# Patient Record
Sex: Male | Born: 1945 | Race: White | Hispanic: No | Marital: Married | Smoking: Never smoker
Health system: Southern US, Community
[De-identification: ages and names within clinical notes are randomized; demographics above are authoritative.]

## PROBLEM LIST (undated history)

## (undated) DIAGNOSIS — R519 Headache, unspecified: Secondary | ICD-10-CM

## (undated) DIAGNOSIS — R011 Cardiac murmur, unspecified: Secondary | ICD-10-CM

## (undated) DIAGNOSIS — I219 Acute myocardial infarction, unspecified: Secondary | ICD-10-CM

## (undated) DIAGNOSIS — M1990 Unspecified osteoarthritis, unspecified site: Secondary | ICD-10-CM

## (undated) DIAGNOSIS — I2510 Atherosclerotic heart disease of native coronary artery without angina pectoris: Secondary | ICD-10-CM

## (undated) DIAGNOSIS — G4730 Sleep apnea, unspecified: Secondary | ICD-10-CM

## (undated) DIAGNOSIS — I10 Essential (primary) hypertension: Secondary | ICD-10-CM

## (undated) DIAGNOSIS — R413 Other amnesia: Secondary | ICD-10-CM

## (undated) HISTORY — PX: BACK SURGERY: SHX140

## (undated) HISTORY — PX: CORONARY ARTERY BYPASS GRAFT: SHX141

---

## 2001-03-23 ENCOUNTER — Encounter: Payer: Self-pay | Admitting: Physical Medicine and Rehabilitation

## 2001-03-23 ENCOUNTER — Encounter
Admission: RE | Admit: 2001-03-23 | Discharge: 2001-03-23 | Payer: Self-pay | Admitting: Physical Medicine and Rehabilitation

## 2001-05-04 ENCOUNTER — Ambulatory Visit (HOSPITAL_COMMUNITY): Admission: RE | Admit: 2001-05-04 | Discharge: 2001-05-04 | Payer: Self-pay | Admitting: Orthopaedic Surgery

## 2001-05-04 ENCOUNTER — Encounter: Payer: Self-pay | Admitting: Orthopaedic Surgery

## 2001-06-25 ENCOUNTER — Encounter: Payer: Self-pay | Admitting: Orthopaedic Surgery

## 2001-07-02 ENCOUNTER — Encounter (INDEPENDENT_AMBULATORY_CARE_PROVIDER_SITE_OTHER): Payer: Self-pay | Admitting: Specialist

## 2001-07-02 ENCOUNTER — Inpatient Hospital Stay (HOSPITAL_COMMUNITY): Admission: RE | Admit: 2001-07-02 | Discharge: 2001-07-06 | Payer: Self-pay | Admitting: Orthopaedic Surgery

## 2001-07-02 ENCOUNTER — Encounter: Payer: Self-pay | Admitting: Orthopaedic Surgery

## 2001-07-05 ENCOUNTER — Encounter: Payer: Self-pay | Admitting: Orthopaedic Surgery

## 2002-02-26 ENCOUNTER — Ambulatory Visit (HOSPITAL_COMMUNITY): Admission: RE | Admit: 2002-02-26 | Discharge: 2002-02-26 | Payer: Self-pay | Admitting: Specialist

## 2003-03-17 ENCOUNTER — Encounter: Admission: RE | Admit: 2003-03-17 | Discharge: 2003-03-17 | Payer: Self-pay | Admitting: Orthopaedic Surgery

## 2003-03-17 ENCOUNTER — Encounter: Payer: Self-pay | Admitting: Orthopaedic Surgery

## 2003-06-21 ENCOUNTER — Encounter: Admission: RE | Admit: 2003-06-21 | Discharge: 2003-06-21 | Payer: Self-pay | Admitting: Orthopaedic Surgery

## 2003-11-22 ENCOUNTER — Encounter: Admission: RE | Admit: 2003-11-22 | Discharge: 2003-11-22 | Payer: Self-pay | Admitting: Orthopaedic Surgery

## 2003-12-02 ENCOUNTER — Encounter: Admission: RE | Admit: 2003-12-02 | Discharge: 2003-12-02 | Payer: Self-pay | Admitting: Orthopaedic Surgery

## 2006-05-22 ENCOUNTER — Inpatient Hospital Stay (HOSPITAL_COMMUNITY): Admission: RE | Admit: 2006-05-22 | Discharge: 2006-05-25 | Payer: Self-pay | Admitting: Orthopedic Surgery

## 2009-07-13 ENCOUNTER — Encounter: Admission: RE | Admit: 2009-07-13 | Discharge: 2009-07-13 | Payer: Self-pay | Admitting: Orthopedic Surgery

## 2010-11-09 NOTE — Discharge Summary (Signed)
[facility]  Patient:    [name], [name] Visit Number: [id number] MRN: [id number]          Service Type: SUR Location: 4W 0480 01 Attending Physician:  Patricia Nettle Dictated by:   Dorie Rank, P.A. Admit Date:  07/02/2001 Discharge Date: 07/06/2001   CC:         [facility]   Discharge Summary  ADMISSION DIAGNOSES: 1. 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 2. Hypertension. 3. History of remote myocardial infarction.  DISCHARGE DIAGNOSES: 1. Status post revision L2-L3 decompression with pedicle screws and left    posterior iliac bone grafting. 2. 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 3. Hypertension. 4. History of remote myocardial infarction.  PROCEDURE:  On July 02, 2001, the patient underwent a revision of L2-L3 decompression, pedicle screw fusion L2-L3 and left posterior iliac bone grafting.  Surgeon Dr. Sharolyn Douglas, assistant Haze Rushing, P.A.-C.  Anesthesia general followed by intrathecal morphine intraoperatively.  No complications.  CONSULTATIONS:  None.  HISTORY OF PRESENT ILLNESS:  This patient is a 65 year old, white male who was seen by Dr. Noel Gerold for continuing problems concerning his low back.  He has underwent epidural steroid injections, previous back surgeries, however, he has really not improved with that.  He had low back pain with radiation to the right thigh when seen by Dr. Noel Gerold in the office.  It was noted that he tried to continue to work, but was having real problems with continuing on his day to day activities.  He had tried nonsteroidal and analgesics for pain, however, this only minimized his pain.  He had a discogram which showed pathology at the L2-L3 interspace with abnormal morphology.  Due to his physical exam as well as diagnostic studies, it was felt he would  benefit from undergoing the above-stated surgery.  The risks and benefits of those procedures were discussed with the patient and he had agreed to proceed.  The patient obtained preoperative clearance by Dr. Leonette Most, of [facility].  HOSPITAL COURSE:  The patient had the above-stated surgery without complications.  He did have the intrathecal morphine intraoperatively and was admitted for continuous pulse oximetry.  He was noted to have no complications and was eventually transferred to the orthopedic floor.  Hemovac drain was placed while in the operating room.  This was discontinued by postop day #2 without difficulty.  For the first night, he was on bed rest and then he was up with physical therapy with the brace.  He was weightbearing as tolerated on the brace.  Initially, he was NPO until flatus.  By postop day #2, clear liquids were started and regular diet was eventually resumed and he tolerated this well with a bowel movement prior to coming home.  IV Ancef was used for the first 48 hours for postop infection control.  A PCA morphine was used and this was discontinued by postop day #2.  He utilized p.o. Percocet throughout the remainder of his hospital stay for pain control.  His home medications were resumed.  He utilized incentive spirometry while in the hospital. [employer] was seeing the patient and fitted him with an LSO brace which he used while in the hospital.  Hemoglobin and hematocrit were checked daily x2 days and found to be stable.  BMET was found to be stable as well.  Occupational and physical therapy  worked with patient on ambulation.  His dressing was changed beginning on postop day #2, and was found to be free of any erythema or drainage.  It was clean and dry on postop day #1.  On July 06, 2001, he was felt to be medically and orthopedically stable for discharge.  LABORATORY DATA AND X-RAY FINDINGS:  H&H on July 03, 2001, was 12.6 and 36.9.   Hemoglobin and hematocrit on July 04, 2001, 12.3 and 36.0.  Coagulation studies on June 25, 2001, within normal limits.  Preoperative BMET on June 25, 2001, revealed a glucose of 301 and albumin of 3.4 on July 03, 2001.  BMET revealed a sodium of 131, glucose 236, calcium 8.0 and otherwise normal.  Liver function studies on June 25, 2001, within normal limits.  On June 25, 2001, UA revealed specific gravity 1.04, 1000 glucose and otherwise normal.  Blood type from July 02, 2001, was O negative.  Two-view chest x-ray on June 25, 2001, revealed no active disease with minimal right basilar atelectasis.  Portable spine views on July 02, 2001, revealed localization of L3 and L4, pedicle screws at L2-L3 and good position of L2-L3 posterior fusion with space device at L2-L3.  The patient had tracings on the chart from June 03, 2001, at Dr. Heinz Knuckles office which revealed normal sinus rhythm.  CONDITION ON DISCHARGE:  Improved.  DISPOSITION:  Discharged to home.  DISCHARGE MEDICATIONS: 1. Resume home medicines except for Celebrex. 2. Vicodin. 3. Robaxin.  ACTIVITY:  Low back precautions.  No lifting greater than five pounds.  No bending or twisting.  Up with brace.  DIET:  Regular diet.  WOUND CARE:  Daily dressing changes.  Shower when no drainage from incision.  FOLLOWUP:  Follow up in 7-10 days with Dr. Noel Gerold.  Call for an appointment.  SPECIAL INSTRUCTIONS:  He should call should he have any questions or concerns prior to his appointment. Dictated by:   Dorie Rank, P.A. Attending Physician:  Patricia Nettle. DD:  07/15/01 TD:  07/16/01 Job: [id number]

## 2010-11-09 NOTE — H&P (Signed)
[facility]  Patient:    [name], [name] Visit Number: [id number] MRN: [id number]          Service Type: Attending:  Patricia Nettle, M.D. Dictated by:   Druscilla Brownie. Shela Nevin, P.A. Adm. Date:  07/02/01   CC:         Loyal Jacobson, M.D., [facility]   History and Physical  DATE OF BIRTH:  April 05, 1946  CHIEF COMPLAINT:  "Pain in my back and legs."  HISTORY OF PRESENT ILLNESS:  This is a 65 year old white male, who has been seen by Dr. Noel Gerold for continuing problems concerning his low back.  His original injury occurred on August 13, 1999.  He eventually underwent epidural steroid injection at [facility]  Eventually he was referred to Dr. Ricki Miller at the [facility] and underwent back surgery on March 25, 2000.  Unfortunately he has really not improved with that.  He sought another opinion and eventually was seen by [place].  The patient continues with pain in the low back with radiation into the right thigh.  He has tried to continue with his work, but now has had real problems with continuing on his day to day activities.  We have given him Lortab for discomfort.  He has weakness in the iliopsoas on the right compared to the left.  He has absent reflexes in the right knee compared to 2+ at the knee on the left.  He has a positive femoral stretch test as well.  Discogram showed disk pathology at the L2-3 interspace with abnormal morphology.  Due to these positive findings and the fact that the patient is really having difficulty with his day to day activities, and the positive findings seen on the examination as well as testing, show that the patient has a recurrent HNP at L2-3 with degenerative disk disease and spinal stenosis.  For this reason we have decided to go ahead and admit him for redo of a lumbar laminectomy, L2-3 with L2-3 posterior spinal fusion with pedicle screws and possible posterolateral  interbody fusion and posterior iliac crest bone graft and allograft.  Shell saver will be used and he has donated two units of autologous blood for this procedure.  PAST MEDICAL HISTORY:  This patient has been cleared preoperatively by Dr. Leonette Most of [facility]. 1. The patient has a history of a myocardial infarction about 11 years ago.    He changed his lifestyle including stopping smoking and has done well    since. 2. He also is being treated for hypertension.  CARDIOLOGIST:  Dr. Beverely Pace in [facility] is his cardiologist.  CURRENT MEDICATIONS: 1. One aspirin a day (will stop prior to surgery). 2. Atenolol 25 mg one q.d. 3. Lisinopril 20 mg one q.d. 4. Lipitor 10 mg one q.d. 5. Allegra 180 mg one q.d. 6. Celebrex 200 mg one q.d. 7. Vicodin for discomfort.  ALLERGIES:  He has no known drug allergies.  PREVIOUS SURGERIES:  Back surgery as mentioned above.  SOCIAL HISTORY:  The patient neither smokes nor drinks.  He is married.  He is an Personnel officer.  He has two children.  FAMILY HISTORY:  Noncontributory.  REVIEW OF SYSTEMS:  CNS:  No seizures, paralysis, double vision, but pain and radiculitis as well as weakness as mentioned above.  CARDIOVASCULAR:  No chest pain, no angina, no orthopnea.  RESPIRATORY:  No productive cough, no hemoptysis, no shortness of breath.  GASTROINTESTINAL:  No nausea, vomiting, melena,  or bloody stools.  GENITOURINARY:  No discharge, dysuria, or hematuria.  MUSCULOSKELETAL:  Primarily in present illness.  PHYSICAL EXAMINATION:  GENERAL:  Alert, cooperative, and friendly, somewhat obese 65 year old white male.  VITAL SIGNS:  Blood pressure 138/78, pulse 48, respirations are 12.  HEENT:  Normocephalic, PERRLA, EOM intact.  Oropharynx is clear.  CHEST:  Clear to auscultation, no rhonchi, no rales.  HEART:  Regular rate and rhythm.  No murmurs are heard.  ABDOMEN:  Obese, soft, nontender.  Liver and spleen not  felt.  GENITALIA/RECTAL:  Not done, not pertinent to present illness.  EXTREMITIES:  Weakness on the right as mentioned above.  ADMITTING DIAGNOSES: 1. Recurrent herniated nucleus pulposus, L2-3 with degenerative disk disease  and spinal stenosis. 2. Hypertension. 3. History of remote myocardial infarction.  PLAN:  The patient will undergo lumbar laminectomy, L2-3, with L2-3 posterior spinal fusion with pedicle screws.  Possible posterolateral interbody fusion and posterior iliac crest bone graft and allograft.  If we should have any medical or cardiology problems, we will need to contact a local cardiologist as this patients physicians are in [facility]. Dictated by:   Druscilla Brownie. Shela Nevin, P.A. Attending:  Patricia Nettle, M.D. DD:  06/23/01 TD:  06/23/01 Job: [id number]

## 2010-11-09 NOTE — Discharge Summary (Signed)
[name], [name] NO.:  [id number]   MEDICAL RECORD NO.:  [id number]          PATIENT TYPE:  INP   LOCATION:  5017                         FACILITY:  [facility]   PHYSICIAN:  Almedia Balls. Ranell Patrick, M.D. DATE OF BIRTH:  Nov 27, 1945   DATE OF ADMISSION:  05/22/2006  DATE OF DISCHARGE:  05/25/2006                               DISCHARGE SUMMARY   ADMISSION DIAGNOSIS:  Left knee end-stage osteoarthritis.   DISCHARGE DIAGNOSIS:  Left knee end-stage osteoarthritis.   PROCEDURE PERFORMED:  Left knee total knee replacement by Dr. Ranell Patrick,  again, performed on 05/22/06.   CONSULTING SERVICES:  Physical therapy, occupational therapy, discharge  planning.   HISTORY OF PRESENT ILLNESS:  The patient is a 65 year old male with a  history of worsening left knee pain.  The patient has end-stage  osteoarthritis on x-rays, failed all conservative means, consisting of  activity modification, anti-inflammatory medications, injections, who  presents now for total knee replacement to restore function and  eliminate pain.  For further details of the patient's past medical  history and physical examination, please see the medical record.   HOSPITAL COURSE:  The patient was admitted to the orthopedic surgery  service and taken to surgery on 05/22/06.  The patient had a left total  knee replacement performed.  The patient postoperatively went to the  floor, where he had an uncomplicated postoperative course, remaining  afebrile, following a tolerating a regular diet, and was up with  physical therapy prior to his discharge.  He was discharged to home in  stable condition on a regular diet with his preadmission medications  plus medication for pain and a muscle relaxant.  He will be following up  with physical therapy, home health physical therapy and OT, as well as  home health nursing.  He was placed on Coumadin per pharmacy protocol.  We will be seeing him in the orthopedic office in 2  weeks.      Almedia Balls. Ranell Patrick, M.D.  Electronically Signed     SRN/MEDQ  D:  08/06/2006  T:  08/07/2006  Job:  [id number]

## 2010-11-09 NOTE — H&P (Signed)
[name], [name]               ACCOUNT NO.:  [id number]   MEDICAL RECORD NO.:  [id number]          PATIENT TYPE:  INP   LOCATION:  NA                           FACILITY:  [facility]   PHYSICIAN:  Maisie Fus B. Dixon, P.A.  DATE OF BIRTH:  [birth year]   DATE OF ADMISSION:  DATE OF DISCHARGE:                                HISTORY & PHYSICAL   CHIEF COMPLAINT:  Left knee pain.   HISTORY OF PRESENT ILLNESS:  The patient has had increasing left knee pain  and worsening ambulation and gait over the last several months.  It has been  refractory to conservative treatment.  The patient has elected to have a  total knee arthroplasty by Dr. Malon Kindle.   PAST MEDICAL HISTORY:  Past medical history is significant for hypertension  and diabetes.   FAMILY MEDICAL HISTORY:  Family medical history is coronary artery disease,  diabetes, and osteoarthritis.   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  Does  not smoke or use alcohol and is a patient of Dr. Leonette Most.   DRUG ALLERGIES:  PERCOCET.   MEDICATIONS:  1. Lisinopril 40 mg 1/2 tablet p.o. b.i.d.  2. Simvastatin 20 mg p.o. daily.  3. Omeprazole 20 mg p.o. daily.  4. Allegra 180 mg p.o. daily.  5. Aspirin 81 mg p.o. daily.  6. Vicodin 5 mg 1 to 2 tablets q.4-6 h. p.r.n. pain.  7. Glucosamine chondroitin.  8. Metformin 1000 mg p.o. b.i.d.  9. Glipizide 10 mg p.o. b.i.d.  10.Niacin 500 mg p.o. b.i.d.   REVIEW OF SYSTEMS:  Negative except for pain with ambulation.   PHYSICAL EXAMINATION:  The patient has a pulse of 80, respirations 16, blood  pressure 138/76.  GENERAL:  This is a pleasant healthy-appearing 65 year old male in no acute  distress.  Pleasant mood and affect.  Alert and oriented x3.  Examination of the head and neck shows full range of motion.  Sensation  grossly intact.  Cranial Nerves II-XII are grossly intact.  CHEST:  Active breath sounds bilaterally with no wheezes, rhonchi, or rales.  HEART:   Regular rate and rhythm, but he does have a 3/6 holosystolic murmur.  ABDOMEN:  Nontender and nondistended with active bowel sounds.  EXTREMITIES:  Moderate tenderness to the left knee with a mildly antalgic  gait and moderate crepitus with range of motion.  SKIN:  No rashes.  NEUROLOGIC:  He is intact distal bilateral lower and upper extremities.   X-ray shows severe osteoarthritis to his left knee.   IMPRESSION:  Left knee osteoarthritis, severe.   PLAN:  Plan of action is to have a left total knee arthroplasty by Dr.  Malon Kindle.      Thomas B. Ferne Coe.     TBD/MEDQ  D:  05/14/2006  T:  05/14/2006  Job:  [id number]

## 2010-11-09 NOTE — Op Note (Signed)
[facility]  Patient:    [name], [name] Visit Number: [id number] MRN: [id number]          Service Type: SUR Location: 4W 0480 01 Attending Physician:  Patricia Nettle Dictated by:   Patricia Nettle, M.D. Proc. Date: 07/02/01 Admit Date:  07/02/2001                             Operative Report  DATE OF BIRTH:  April 20, 1946  PROCEDURES:  1. Revision lumbar laminectomy L2-3 with lateral recess decompression,     facetectomy, and diskectomy.  2. L2-3 posterior lateral arthrodesis.  3. Pedicle screw instrumentation L2 and L3.  4. Transforaminal lumbar interbody fusion L2-3 with Synthes Peek interbody     prosthesis.  5. Local autogenous bone graft.  6. Left posterior iliac crest bone graft.  7. Allograft using DBX mix.  8. Intrathecal injection of 2 cc long-acting preservative-free morphine.  DIAGNOSES:  1. Recurrent disk herniation L2-3.  2. Degenerative disk disease.  3. Diabetes.  SURGEON:  Patricia Nettle, M.D.  ASSISTANT:  Dorie Rank, P.A.  ANESTHESIA:  General endotracheal.  COMPLICATIONS:  None.  INDICATIONS FOR PROCEDURE:  The patient is a 65 year old male who is status post a right L2-3 microlumbar diskectomy in October of 2001. After the procedure, his symptoms were no better. Over the last several years, he has actually gotten worse. 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 His CT myelogram showed a recurrent herniation at L2-3 with compromise of the L2 and 3 roots. Provocative diskography showed concordant pain L2 to the sacrum. He did have a vacuum disk phenomenon at L2-3 with concordant pain at 7 PSI. His EMG study was consistent with a right L3 and 4 radiculopathy. After a long discussion with the patient, it was decided to address the L2-3 segment with revision decompression and fusion. He understands that he did have a four level positive diskogram and he may very well have  continued back pain after the procedure.  DESCRIPTION OF PROCEDURE:  The patient was properly identified in the holding area and taken to the operating room. He was given prophylactic antibiotics. He underwent general endotracheal anesthesia without difficulty. Appropriate lines were placed. He was turned prone onto the [employer] frame. Care was taken to pad all bony prominences and peripheral nerves. The face and eyes were protected. The back was prepped and draped in the usual sterile fashion. A midline incision was utilized incorporating the old scar and extending it 2 cm in each direction. Dissection was carried down the spinous processes. At this point, two Kochers were placed and an intraoperative spot film was taken. Confirmation of the L2 and 3 levels was made. The paraspinal muscles were then stripped out to the tips of the transverse processes at L2 and 3. Care was taken to protect the L1-2 as well as the 3-4 joint below. The capsule was removed from the L2-3 segment bilaterally. Extreme care was taken when approaching the previous laminotomy defect at L2-3 on the right. Revision microsurgical decompression was then performed by removing the spinous process of L2. There was extensive scarring which caught the midline to the left side. The right L3 nerve root was encased and scar tissue in this was meticulously dissected out of the lateral recess. A facetectomy was performed on the right side, removing the remnant of inferior facet  as well as the tip of the superior facet. This allowed additional exposure of the L3 root and further neurolysis could be carried out. At this point, the L2-3 disk was identified on the right side within the foramen. Epidural veins were carefully coagulated with the bipolar. Care was taken to protect the exiting L2 root. This was not exposed but retracted proximally with the surrounding soft tissue. At this point, pedicle screws were  placed at L2 and 3 bilaterally using an anatomic probing technique. First the awl followed by the pedicle probe and then the ball-tip feeler tap and screw. The 45 mm 6.2 Synthes Click X screws were placed at L2 bilaterally. At L3, a 40 mm 6.2 diameter Synthes Click X screws were placed bilaterally. At this point, distraction was applied across the left sided pedicle screws. This allowed further access to the right transforaminal window that had been created. An annulotomy was performed using a 15 blade. Again extreme care was taken to protect the exiting L2 root. Pituitary rongeurs were used to remove the remaining disk material within the intervertebral space. Side biting curettes and reamed curettes were then utilized to remove the cartilaginous endplate. This was further debrided using the pituitary rongeurs. A rasp was then utilized to roughen the endplate up both inferiorly and superiorly. Copious irrigation was used within the disk space. At this point, attention was directed to the left posterior iliac crest. A 4 cm incision was made directly over the crest. This was carried down to the bone. The cap of the crest was removed using a Leksell rongeur. Curettes were utilized to remove bone from within the table. The wound was irrigated. Hemostasis was achieved. The deep fascia was closed with #1 Vicryl. The skin was closed with 2-0 Vicryl and skin staples. At this point, attention was directed back to the midline wound. Copious amounts of autogenous iliac crest bone graft was packed into the intervertebral space through the transforaminal window. Patches were utilized to push it across the midline. Trial spacers were then utilized from the Synthes T lift system. A size 9 Peek T lift spacer was then chosen and packed with autogenous iliac crest bone graft. This was carefully inserted into the intervertebral space with care taken to protect the dural sac as well as the exiting L2 root.  Additional bone was then placed lateral to and posterior to the intervertebral spacer. The Click X caps were then placed on  the screws. Appropriate size rods were chosen and gentle compression was applied across the pedicle screw instrumentation. Again the nerve roots were felt and found to be free using a blunt probe. It should be noted that the left L3 root was also decompressed and found to be patent out the foramen. The wound was copiously irrigated. The transverse processes and pars interarticularis were decorticated. The remaining autogenous bone graft was placed into the lateral gutter between L2 and 3 bilaterally. Local autogenous bone graft that was collected from the lamina and spinous process were then packed on top of this followed by DBX allograft mix. A deep drain was placed. A 25 gauge spinal needle was then used to cannulate the intrathecal space at L3-4 and 2 cc of preservative-free morphine was then injected for postoperative pain control. The fascia was closed using a #1 running Vicryl suture. The subcutaneous layers were closed using 2-0 Vicryl interrupted suture. The skin was closed using staples. A sterile dressing was applied. The patient was turned prone and extubated without difficulty. He was  moving his toes and transferred to the recovery room in stable condition. Intraoperative x-rays confirmed excellent positioning of the pedicle screw as well as the intervertebral spacer. Dictated by:   Patricia Nettle, M.D. Attending Physician:  Patricia Nettle. DD:  07/02/01 TD:  07/03/01 Job: [id number]

## 2010-11-09 NOTE — Op Note (Signed)
NAMEQUIRINO, Mike NO.:  Hess   MEDICAL RECORD NO.:  [id number]          PATIENT TYPE:  INP   LOCATION:  2899                         FACILITY:  [facility]   PHYSICIAN:  Almedia Balls. Ranell Patrick, M.D. DATE OF BIRTH:  [birth year]   DATE OF PROCEDURE:  05/22/2006  DATE OF DISCHARGE:                               OPERATIVE REPORT   PREOPERATIVE DIAGNOSIS:  Left knee end-stage osteoarthritis.   POSTOPERATIVE DIAGNOSIS:  Left knee end-stage osteoarthritis.   PROCEDURE PERFORMED:  Left knee total knee replacement using DePuy Sigma  rotating platform prosthesis.   SURGEON:  Almedia Balls. Ranell Patrick, M.D.   ASSISTANT:  Donnie Coffin. Dixon, P.A.-C.   ANESTHESIA:  General anesthesia, plus femoral block anesthesia were  used.   ESTIMATED BLOOD LOSS:  Minimal.   FLUID REPLACEMENT:  2000 cc crystalloid.   TOURNIQUET TIME:  1 hour 45 minutes at 350 mmHg.   COUNTS:  Instrument count was correct.   COMPLICATIONS:  There were no complications.   PERIOPERATIVE ANTIBIOTICS:  Given.   INDICATIONS:  The patient is a 65 year old male with  history of end-  stage osteoarthritis of the left knee.  The patient presents now with  disabling pain and functional loss, desiring operative total knee  arthroplasty to restore function and eliminate pain.  Informed consent  was obtained.   DESCRIPTION OF PROCEDURE:  After an adequate level of anesthesia was  achieved, the patient was positioned supine on the operating room table.  The left leg had a nonsterile tourniquet placed proximally, and the left  leg was sterilely prepped and draped in the usual manner.  The right leg  was padded appropriately and secured to the table.  After exsanguination  of the limb using an Esmarch bandage, we elevated the tourniquet to 350  mmHg.  A longitudinal skin incision was created in the midline with the  knee flexed.  Dissection was carried sharply down through the  subcutaneous tissues.  A medial  parapatellar arthrotomy was developed  utilizing a fresh 10-blade scalpel.  We then everted the patella and  exposed the distal femur.  We used a step drill to enter the distal  femur, and then an intramedullary distal femoral guide to resect 10 mm  off the femur with a 5-degree left setting.  Once we did the end-cut, we  sized the femur to a size 4.  This was in between a 4 and a 5.  We  marked it at 4.5, and then pinned it for a pin, and then we went ahead  and placed the 4-in-1 cutting block distally in the pin holes.  We were  very happy with the rotation, and using the angel wing, we made sure  that we were not getting a notch.  Once we made our anterior cut, we  were flush with the anterior cortex of the femur; and, thus, happy with  that, we went ahead and made our posterior cut and our chamfer cuts.  No  excessive amount of bone was removed posteriorly; thus, we feel we  likely sized the femur appropriately and had it  positioned appropriately  in the anterior-to-posterior plane.  Once the 4 cuts were made on the  femur, we went ahead and resected the ACL, PCL and meniscus, and then  subluxed the tibia forward.  We went ahead and made our 90-degree  perpendicular cut to the tibia with 6 mm of resection off the affected  side.  We went ahead and checked our flexion and extension gaps and we  were happy with those with the 10 spacer block.  Next, we went ahead and  reapproached the femur, utilized the box cut guide to make the box cut  for the posterior-stabilized implant, and then back again to the tibia  to size the tibia, which was a 4, and then place the keel punch, and  went ahead and prepared the tibia for the tibial prosthesis.  We  inspected the posterior aspect of the joint to make sure we had all soft  tissue and bony prominences removed, as well as the posterior femoral  condyle.  Next, we went ahead and trialed our components.  We had a 4  left femur, and a 4 tibia and a  10 and a 12.5 insert.  It felt like we  were able to get the 12.5 in.  We placed the knee in extension.  We went  ahead and resurfaced the patella, utilizing an oscillating saw.  We  started out with 25 to 26-mm thickness of the patella, resected down to  16, and then went ahead and replaced 9.5 mm with the 38 patella from  DePuy.  Once we put the patella in place, we had excellent stability,  with patella tracking with no-touch technique.  We removed all the trial  components, thoroughly pulse irrigated and then vacuum mixed the cement,  and we went ahead and cemented the components into place with the  SmartSet Cement by DePuy.  With all the components properly cemented  into place, we then placed the knee into full extension.  We cemented  the patella and placed the patellar clamp.  We removed the excess  cement, while it was setting up, and then once it was fully hardened, we  retrialed the knee, both with the 10 and the 12.5.  The 10-mm insert  allowed for slight hyperextension.  The 12.5 allowed [place] to get to full  extension, but no hyperextension, and a little bit better feel in  flexion.  At this point, we selected the 12.5-mm real insert, removed  the trial insert, inspected for any loose cement debris.  Finding none,  we went ahead and placed our real 12.5 insert in place.  We took the  knee through a full range of motion, and excellent stability in full  extension was noted, with perfect patellar tracking.  We went ahead and  thoroughly irrigated, placed a drain deep in the joint, and then went  ahead and closed the parapatellar arthrotomy with interrupted #1 Vicryl  suture in a figure-of-eight fashion, followed by 0 Vicryl deep  subcutaneous and 2-0 superficial subcutaneous and 4-0 Monocryl for the  skin.  Steri-Strips were placed on the wound.  The patient tolerated the  surgery very well and was taken to the recovery room in stable  condition.     Almedia Balls. Ranell Patrick, M.D.   Electronically Signed     SRN/MEDQ  D:  05/22/2006  T:  05/23/2006  Job:  [id number]

## 2010-11-09 NOTE — Op Note (Signed)
   [name], [name]                        ACCOUNT NO.:  [id number]   MEDICAL RECORD NO.:  [id number]                   PATIENT TYPE:  AMB   LOCATION:  DAY                                  FACILITY:  [facility]   PHYSICIAN:  Philips J. Montez Morita, M.D.             DATE OF BIRTH:  01/09/1946   DATE OF PROCEDURE:  02/26/2002  DATE OF DISCHARGE:                                 OPERATIVE REPORT   PREOPERATIVE DIAGNOSIS:  Torn medial meniscus, left knee.   POSTOPERATIVE DIAGNOSIS:  Torn medial meniscus, left knee with degenerative  arthritis central femur and medial femoral condyle.   OPERATIVE PROCEDURE:  Partial medial meniscectomy.   SURGEON:  Deloris Ping. Montez Morita, M.D.   DESCRIPTION OF PROCEDURE:  After suitable general anesthesia, the leg was  exsanguinated and upper thigh tourniquet was inflated to 400 mmHg, and the  knee was prepped and draped routinely and put in arthroscopy leg holder.  Arthroscopy was carried out through the routine portals with a lateral  parapatellar for inflow and anterolateral for the scope and anteromedial for  instrumentation.  Pertinent pathology revealed significant arthritic wear in  the intercondylar notch of the femur and over the medial aspect of the  medial femoral condyle, although not particularly in the weightbearing area.  There was a small little vertical tear in the posterior horn of the medial  meniscus, and this was trimmed back to nice smooth meniscal tissue.  The  rest of the meniscus was probed and does not seem to be significant enough  to cause the amount of pain that he has been having.  Lateral meniscus was  inspected and looks quite normal as does the ACL.  There is quite a thick  ligamentum mucosum with some anterior plica material, and this was resected  and removed, but there was no large anteromedial or superomedial plica.  At  the end of the procedure, 20 cc of 0.5% Marcaine with adrenalin were put  into the knee.  A few cc of it  were used in the portals for pain control.  Nice compression dressing.  He goes to recovery in good condition.  The  tourniquet was up about 40 minutes.                                               Philips J. Montez Morita, M.D.    PJC/MEDQ  D:  02/26/2002  T:  02/26/2002  Job:  [id number]

## 2011-07-13 ENCOUNTER — Emergency Department (HOSPITAL_BASED_OUTPATIENT_CLINIC_OR_DEPARTMENT_OTHER)
Admission: EM | Admit: 2011-07-13 | Discharge: 2011-07-13 | Disposition: A | Discharge status: Home / Self Care | Payer: Medicare Other | Attending: Emergency Medicine | Admitting: Emergency Medicine

## 2011-07-13 ENCOUNTER — Encounter (HOSPITAL_BASED_OUTPATIENT_CLINIC_OR_DEPARTMENT_OTHER): Payer: Self-pay | Admitting: *Deleted

## 2011-07-13 DIAGNOSIS — R040 Epistaxis: Secondary | ICD-10-CM | POA: Insufficient documentation

## 2011-07-13 DIAGNOSIS — Z79899 Other long term (current) drug therapy: Secondary | ICD-10-CM | POA: Insufficient documentation

## 2011-07-13 HISTORY — DX: Atherosclerotic heart disease of native coronary artery without angina pectoris: I25.10

## 2011-07-13 HISTORY — DX: Essential (primary) hypertension: I10

## 2011-07-13 NOTE — ED Provider Notes (Signed)
History    Scribed for Hurman Horn, MD, the patient was seen in room MH04/MH04. This chart was scribed by Katha Cabal.   CSN: [id number]  Arrival date & time 07/13/11  2023   First MD Initiated Contact with Patient 07/13/11 2141      Chief Complaint  Patient presents with  . Epistaxis    (Consider location/radiation/quality/duration/timing/severity/associated sxs/prior treatment)   BENJAMIM Hess is a 66 y.o. male who presents to the Emergency Department complaining of spontaneous mild to moderate right nare epistaxis.  Epistaxis occurred while patient was watiching TV this evening.  The episode is rapidly resolving. Patient swallowed a lot of blood. There has been no vomiting or syncope.   The problem is new.  Patient denies prior episodes.  There has been no trauma.  Patient applied pressure prior to arrival.  Symptoms are not associated with chest pain, abdominal pain, headache, cough or neck pain.      Past Medical History  Diagnosis Date  . Coronary artery disease   . Hypertension   . Diabetes mellitus     Past Surgical History  Procedure Date  . Coronary artery bypass graft   . Back surgery     History reviewed. No pertinent family history.  History  Substance Use Topics  . Smoking status: Never Smoker   . Smokeless tobacco: Not on file  . Alcohol Use: No      Review of Systems  Constitutional: Negative for fever.       10 Systems reviewed and are negative for acute change except as noted in the HPI.  HENT: Positive for nosebleeds. Negative for congestion.   Eyes: Negative for discharge and redness.  Respiratory: Negative for cough and shortness of breath.   Cardiovascular: Negative for chest pain.  Gastrointestinal: Negative for vomiting and abdominal pain.  Musculoskeletal: Negative for back pain.  Skin: Negative for rash.  Neurological: Negative for syncope, numbness and headaches.  Psychiatric/Behavioral:       No behavior change.     Allergies  Review of patient's allergies indicates no known allergies.  Home Medications   Current Outpatient Rx  Name Route Sig Dispense Refill  . AMLODIPINE BESYLATE 10 MG PO TABS Oral Take 10 mg by mouth daily.    . ASPIRIN EC 81 MG PO TBEC Oral Take 81 mg by mouth daily.    . CHOLECALCIFEROL 1000 UNITS PO TABS Oral Take 1,000 Units by mouth daily.    Marland Kitchen FEXOFENADINE HCL 180 MG PO TABS Oral Take 180 mg by mouth daily.    Marland Kitchen GLIPIZIDE 10 MG PO TABS Oral Take 10 mg by mouth 2 (two) times daily before a meal.    . GLUCOSAMINE-CHONDROITIN 500-400 MG PO TABS Oral Take 1 tablet by mouth 2 (two) times daily.     Marland Kitchen HYDROCODONE-ACETAMINOPHEN 5-500 MG PO TABS Oral Take 1 tablet by mouth every 6 (six) hours as needed. For pain    . INSULIN GLARGINE 100 UNIT/ML [place] SOLN Subcutaneous Inject 16 Units into the skin at bedtime.    Marland Kitchen LISINOPRIL 40 MG PO TABS Oral Take 20 mg by mouth daily.    Marland Kitchen METFORMIN HCL 1000 MG PO TABS Oral Take 1,000 mg by mouth 2 (two) times daily with a meal.    . ADULT MULTIVITAMIN W/MINERALS CH Oral Take 1 tablet by mouth daily.    Marland Kitchen NIACIN 500 MG PO TABS Oral Take 500 mg by mouth 2 (two) times daily with a meal.    .  OMEPRAZOLE 20 MG PO CPDR Oral Take 20 mg by mouth daily.    Marland Kitchen PRAVASTATIN SODIUM 20 MG PO TABS Oral Take 20 mg by mouth daily.      BP 121/62  Pulse 75  Temp(Src) 97.9 F (36.6 C) (Oral)  Resp 18  SpO2 99%  Physical Exam  Nursing note and vitals reviewed. Constitutional:       Awake, alert, nontoxic appearance.  HENT:  Head: Atraumatic.       Minor septal mucosal bleeding, no packing needed  Eyes: Right eye exhibits no discharge. Left eye exhibits no discharge.  Neck: Neck supple.  Pulmonary/Chest: Effort normal. He exhibits no tenderness.  Abdominal: Soft. There is no tenderness. There is no rebound.  Musculoskeletal: He exhibits no tenderness.       Baseline ROM, no obvious new focal weakness. Baseline lower back pain  Neurological:        Mental status and motor strength appears baseline for patient and situation.  Skin: No rash noted.  Psychiatric: He has a normal mood and affect.    ED Course  Procedures (including critical care time)   DIAGNOSTIC STUDIES: Oxygen Saturation is 99% on room air, normal by my interpretation.     COORDINATION OF CARE:  9:51 PM  Physical exam complete.  Discussed treatment options with patient.   10:06 PM  QR powder applied.  Actively bleeding in right nare.   10:24 PM  Recheck.  Reapplication of QR powder.  No active bleeding in right nare.  11:01 PM  Recheck.  No recurrent bleeding.   11:04 PM  Plan to discharge patient.  Patient agrees with plan.       LABS / RADIOLOGY:   Labs Reviewed - No data to display No results found.   1. Epistaxis       MDM  I doubt any other EMC precluding discharge at this time including, but not necessarily limited to the following:uncontrolled bleeding.    I personally performed the services described in this documentation, which was scribed in my presence. The recorded information has been reviewed and considered.  Hurman Horn, MD 07/14/11 1158

## 2011-07-13 NOTE — ED Notes (Signed)
D/c home with spouse- no rx given

## 2011-07-13 NOTE — ED Notes (Signed)
Pt states that while watching TV this PM he began having a nose bleed denies recent injury denies pain

## 2011-07-13 NOTE — ED Notes (Signed)
Bednar MD at bedside. 

## 2011-10-30 ENCOUNTER — Other Ambulatory Visit: Payer: Self-pay | Admitting: Orthopaedic Surgery

## 2011-10-30 DIAGNOSIS — M549 Dorsalgia, unspecified: Secondary | ICD-10-CM

## 2011-11-05 ENCOUNTER — Ambulatory Visit
Admission: RE | Admit: 2011-11-05 | Discharge: 2011-11-05 | Disposition: A | Discharge status: Home / Self Care | Payer: Medicare Other | Source: Ambulatory Visit | Attending: Orthopaedic Surgery | Admitting: Orthopaedic Surgery

## 2011-11-05 DIAGNOSIS — M549 Dorsalgia, unspecified: Secondary | ICD-10-CM

## 2015-08-02 ENCOUNTER — Encounter: Payer: Medicare Other | Admitting: Neurology

## 2015-08-10 ENCOUNTER — Ambulatory Visit (INDEPENDENT_AMBULATORY_CARE_PROVIDER_SITE_OTHER): Payer: Medicare Other | Admitting: Diagnostic Neuroimaging

## 2015-08-10 ENCOUNTER — Encounter (INDEPENDENT_AMBULATORY_CARE_PROVIDER_SITE_OTHER): Payer: Self-pay | Admitting: Diagnostic Neuroimaging

## 2015-08-10 DIAGNOSIS — R208 Other disturbances of skin sensation: Secondary | ICD-10-CM | POA: Diagnosis not present

## 2015-08-10 DIAGNOSIS — Z0289 Encounter for other administrative examinations: Secondary | ICD-10-CM

## 2015-08-10 DIAGNOSIS — R200 Anesthesia of skin: Secondary | ICD-10-CM

## 2015-08-10 NOTE — Procedures (Signed)
   [facility]  NCS (NERVE CONDUCTION STUDY) WITH EMG (ELECTROMYOGRAPHY) REPORT   STUDY DATE: 08/10/15 PATIENT NAME: [name] DOB: June 15, 1946 MRN: [id number]  ORDERING CLINICIAN: Merla Riches, MD  TECHNOLOGIST: Gearldine Shown  ELECTROMYOGRAPHER: Glenford Bayley. Penumalli, MD  CLINICAL INFORMATION: 70 year old male with diabetes, multiple lumbar fusion surgeries, here for evaluation of left leg and foot numbness ever since last surgery in January 2016.  FINDINGS: NERVE CONDUCTION STUDY: Bilateral peroneal motor responses have normal distal latencies, decreased amplitudes, normal conduction velocities and normal F-wave latencies.  Bilateral tibial motor responses of normal distal latencies, decreased amplitudes, normal conduction velocities and absent F wave latencies.  Bilateral H reflex responses could not be obtained.  Bilateral sural and peroneal sensory responses could not be obtained.   NEEDLE ELECTROMYOGRAPHY: Needle examination of left lower extremity iliopsoas, vastus medialis, tibialis anterior, tibials posterior, gastrocnemius is normal. Left L5-S1 paraspinal muscles are normal.   IMPRESSION:  Abnormal study demonstrate: 1. Severe axonal sensory motor polyneuropathy. 2. 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    INTERPRETING PHYSICIAN:  Suanne Marker, MD Certified in Neurology, Neurophysiology and Neuroimaging  [facility] [address], [address] [place], [place] [address] [phone]

## 2017-03-24 ENCOUNTER — Encounter: Payer: Self-pay | Admitting: Podiatry

## 2017-03-24 ENCOUNTER — Ambulatory Visit (INDEPENDENT_AMBULATORY_CARE_PROVIDER_SITE_OTHER): Payer: Medicare Other | Admitting: Podiatry

## 2017-03-24 VITALS — BP 150/73 | HR 59 | Ht 64.0 in | Wt 234.0 lb

## 2017-03-24 DIAGNOSIS — M79672 Pain in left foot: Secondary | ICD-10-CM

## 2017-03-24 DIAGNOSIS — B351 Tinea unguium: Secondary | ICD-10-CM | POA: Diagnosis not present

## 2017-03-24 DIAGNOSIS — G609 Hereditary and idiopathic neuropathy, unspecified: Secondary | ICD-10-CM | POA: Diagnosis not present

## 2017-03-24 DIAGNOSIS — M79671 Pain in right foot: Secondary | ICD-10-CM

## 2017-03-24 DIAGNOSIS — L600 Ingrowing nail: Secondary | ICD-10-CM

## 2017-03-24 DIAGNOSIS — G629 Polyneuropathy, unspecified: Secondary | ICD-10-CM | POA: Insufficient documentation

## 2017-03-24 NOTE — Progress Notes (Signed)
SUBJECTIVE: 71 y.o. year old male presents complaining of painful feet, L>R.  Stated that he has burning and shooting pain in both lower limbs. Also toe nails are turning dark without apparent injury.  Having difficulty getting his toe nails trimmed due to deformity and hardness.  HPI: Has had back surgery 2 years ago and had implant placed for pain control. Stated that following the surgery the left lower limb felt numb never woke up. Then the feelings came back gradually. Since then he was diagnosed with neuropathy.  He had an episode 2 month ago. He noted when he got up in the morning the right great toe nail turned in black color. He also had an experience in 02/07/17 that the whole left foot turned black and blue which resolved in 2-3 weeks.  Blood sugar A1c was 6.3 about 2 months ago.  REVIEW OF SYSTEMS: Constitutional: negative for chills, fevers, night sweats and weight loss Eyes: negative Respiratory: negative Cardiovascular: negative Gastrointestinal: Diabetic under dontrol. Musculoskeletal:Lower back pain, chronic. Neurological: Post surgical peripheral neuropathy lower limbs. Allergic/Immunologic: negative  OBJECTIVE: DERMATOLOGIC EXAMINATION: Discolored nail plate left great toe. Hypertrophic dystrophic nails x 10.  VASCULAR EXAMINATION OF LOWER LIMBS: All pedal pulses are palpable with normal pulsation.  Capillary Filling times within 3 seconds in all digits.  No edema or erythema noted. Temperature gradient from tibial crest to dorsum of foot is within normal bilateral.  NEUROLOGIC EXAMINATION OF THE LOWER LIMBS: All epicritic and tactile sensations grossly intact.  MUSCULOSKELETAL EXAMINATION: No gross deformities noted.   ASSESSMENT: Peripheral neuropathy, slow improvement in progress. Onychomycosis x 10. Neuralgic pain in lower limbs. Diabetic under control.   PLAN: Reviewed findings and available treatment options. Explained that microtrauma to nail  plate is common in peripheral neuropathy cases. May benefit from diabetic shoes to protect toe nails. All nails debrided. May return in 3 months to have the nails debrided.

## 2017-03-24 NOTE — Patient Instructions (Signed)
Seen for pain in left foot. Possible post operative peripheral neuropathy L>R. All nails debrided. May return in 3 months.

## 2017-03-30 DIAGNOSIS — B351 Tinea unguium: Secondary | ICD-10-CM | POA: Insufficient documentation

## 2017-06-30 ENCOUNTER — Encounter: Payer: Self-pay | Admitting: Podiatry

## 2017-06-30 ENCOUNTER — Ambulatory Visit (INDEPENDENT_AMBULATORY_CARE_PROVIDER_SITE_OTHER): Payer: Medicare Other | Admitting: Podiatry

## 2017-06-30 DIAGNOSIS — L600 Ingrowing nail: Secondary | ICD-10-CM

## 2017-06-30 DIAGNOSIS — G609 Hereditary and idiopathic neuropathy, unspecified: Secondary | ICD-10-CM

## 2017-06-30 DIAGNOSIS — B351 Tinea unguium: Secondary | ICD-10-CM

## 2017-06-30 NOTE — Progress Notes (Signed)
Subjective: 72 y.o. year old male patient presents complaining of painful nails. Right great toe nail hurts in closed in shoes. No change in neuropathy pain in lower limbs. Accompanied by his wife and uses cane.   HPI: Has had back surgery 2 years ago and had implant placed for pain control. Stated that following the surgery the left lower limb felt numb never woke up. Then the feelings came back gradually. Since then he was diagnosed with neuropathy.   Objective: Dermatologic: Thick yellow deformed nails x 10. Ingrown hallucal nail right lateral border with pain. No broken skin or drainage noted.  Vascular: Pedal pulses are all palpable. Orthopedic: Contracted lesser digits  Neurologic: Diagnosed with peripheral neuropathy.  Assessment: Dystrophic mycotic nails x 10. Ingrown right great toe lateral border with pain. Neuralgic pain in lower limbs with peripheral neuropathy.  Treatment: All mycotic nails debrided.  Return in 3 months or as needed.

## 2017-06-30 NOTE — Patient Instructions (Signed)
Seen for hypertrophic nails. Ingrown right hallucal nail debrided. All nails debrided. Return in 3 months or as needed.

## 2017-09-22 ENCOUNTER — Ambulatory Visit (INDEPENDENT_AMBULATORY_CARE_PROVIDER_SITE_OTHER): Payer: Medicare Other | Admitting: Podiatry

## 2017-09-22 ENCOUNTER — Encounter: Payer: Self-pay | Admitting: Podiatry

## 2017-09-22 DIAGNOSIS — G609 Hereditary and idiopathic neuropathy, unspecified: Secondary | ICD-10-CM

## 2017-09-22 DIAGNOSIS — L600 Ingrowing nail: Secondary | ICD-10-CM | POA: Diagnosis not present

## 2017-09-22 DIAGNOSIS — B351 Tinea unguium: Secondary | ICD-10-CM | POA: Diagnosis not present

## 2017-09-22 NOTE — Progress Notes (Signed)
Subjective: 72 y.o. year old male patient presents complaining of painful nails. Patient requests toe nails trimmed.   HPI: Has had back surgery2 years agoand had implantplacedfor paincontrol.Stated that following the surgery the leftlower limb felt numbnever woke up. Then the feelings came back gradually.Since then he was diagnosed with neuropathy.   Objective: Dermatologic: Thick yellow deformed nails x 10. Ingrown hallucal nails without broken skin. Vascular: Pedal pulses are all palpable. Orthopedic: No gross deformities. Neurologic: Diagnosed with peripheral neuropathy.  Assessment: Dystrophic mycotic nails x 10. Ingrown nails. Neuropathic pain both feet.  Treatment: All mycotic nails debrided.  Return in 3 months or as needed.

## 2017-09-22 NOTE — Patient Instructions (Signed)
Seen for hypertrophic nails. All nails debrided. Return in 3 months or as needed.  

## 2017-12-22 ENCOUNTER — Ambulatory Visit (INDEPENDENT_AMBULATORY_CARE_PROVIDER_SITE_OTHER): Payer: Medicare Other | Admitting: Podiatry

## 2017-12-22 ENCOUNTER — Encounter: Payer: Self-pay | Admitting: Podiatry

## 2017-12-22 DIAGNOSIS — B351 Tinea unguium: Secondary | ICD-10-CM

## 2017-12-22 DIAGNOSIS — G609 Hereditary and idiopathic neuropathy, unspecified: Secondary | ICD-10-CM

## 2017-12-22 DIAGNOSIS — L600 Ingrowing nail: Secondary | ICD-10-CM

## 2017-12-22 NOTE — Progress Notes (Signed)
Subjective: 72 y.o. year old male patient presents complaining of painful nails. Patient requests toe nails trimmed. Big toe nails has been hurting.   Objective: Dermatologic: Thick yellow deformed nails x 10. Ingrown hallucal nails both great toes. Vascular: Pedal pulses are all palpable. Orthopedic: No gross deformities. Neurologic: Subjective numbness and pain in both feet.  Assessment: Dystrophic mycotic nails x 10. Ingrown hallucal nails. Peripheral neuropathy.  Treatment: All mycotic nails debrided.  Return in 3 months or as needed.

## 2017-12-22 NOTE — Patient Instructions (Signed)
Seen for hypertrophic nails. All nails debrided. Return in 3 months or as needed.  

## 2018-02-20 ENCOUNTER — Other Ambulatory Visit: Payer: Self-pay | Admitting: Orthopaedic Surgery

## 2018-02-20 DIAGNOSIS — M546 Pain in thoracic spine: Secondary | ICD-10-CM

## 2018-02-20 DIAGNOSIS — M542 Cervicalgia: Secondary | ICD-10-CM

## 2018-02-24 ENCOUNTER — Telehealth: Payer: Self-pay | Admitting: Nurse Practitioner

## 2018-02-24 NOTE — Telephone Encounter (Signed)
Phone call to patient to verify medication list and allergies for myelogram procedure. Pt instructed to hold Bupropion and Cymbalta 48 hrs prior to appointment time. Pt's wife verbalized understanding.

## 2018-03-13 ENCOUNTER — Ambulatory Visit
Admission: RE | Admit: 2018-03-13 | Discharge: 2018-03-13 | Disposition: A | Discharge status: Home / Self Care | Payer: Medicare Other | Source: Ambulatory Visit | Attending: Orthopaedic Surgery | Admitting: Orthopaedic Surgery

## 2018-03-13 DIAGNOSIS — M542 Cervicalgia: Secondary | ICD-10-CM

## 2018-03-13 DIAGNOSIS — M546 Pain in thoracic spine: Secondary | ICD-10-CM

## 2018-03-13 MED ORDER — IOPAMIDOL (ISOVUE-M 300) INJECTION 61%
10.0000 mL | Freq: Once | INTRAMUSCULAR | Status: AC | PRN
  Administered 2018-03-13: 10 mL via INTRATHECAL

## 2018-03-13 MED ORDER — ONDANSETRON HCL 4 MG/2ML IJ SOLN: 4.0000 mg | Freq: Four times a day (QID) | INTRAMUSCULAR | Status: DC | PRN

## 2018-03-13 MED ORDER — DIAZEPAM 5 MG PO TABS
5.0000 mg | ORAL_TABLET | Freq: Once | ORAL | Status: AC
  Administered 2018-03-13: 5 mg via ORAL

## 2018-03-13 NOTE — Progress Notes (Signed)
Patient states he has been off Cymbalta and Zyprexa for at least the past two days.  Donell SievertJeanne Jenise Iannelli, RN

## 2018-03-13 NOTE — Discharge Instructions (Signed)
Myelogram Discharge Instructions  1. Go home and rest quietly for the next 24 hours.  It is important to lie flat for the next 24 hours.  Get up only to go to the restroom.  You may lie in the bed or on a couch on your back, your stomach, your left side or your right side.  You may have one pillow under your head.  You may have pillows between your knees while you are on your side or under your knees while you are on your back.  2. DO NOT drive today.  Recline the seat as far back as it will go, while still wearing your seat belt, on the way home.  3. You may get up to go to the bathroom as needed.  You may sit up for 10 minutes to eat.  You may resume your normal diet and medications unless otherwise indicated.  Drink plenty of extra fluids today and tomorrow.  4. The incidence of a spinal headache with nausea and/or vomiting is about 5% (one in 20 patients).  If you develop a headache, lie flat and drink plenty of fluids until the headache goes away.  Caffeinated beverages may be helpful.  If you develop severe nausea and vomiting or a headache that does not go away with flat bed rest, call [phone].  5. You may resume normal activities after your 24 hours of bed rest is over; however, do not exert yourself strongly or do any heavy lifting tomorrow.  6. Call your physician for a follow-up appointment.     You may resume Cymbalta and Zyban on Saturday, March 14, 2018 after 9:30a.m.

## 2018-03-30 ENCOUNTER — Encounter: Payer: Self-pay | Admitting: Podiatry

## 2018-03-30 ENCOUNTER — Ambulatory Visit (INDEPENDENT_AMBULATORY_CARE_PROVIDER_SITE_OTHER): Payer: Medicare Other | Admitting: Podiatry

## 2018-03-30 DIAGNOSIS — G609 Hereditary and idiopathic neuropathy, unspecified: Secondary | ICD-10-CM | POA: Diagnosis not present

## 2018-03-30 DIAGNOSIS — B351 Tinea unguium: Secondary | ICD-10-CM | POA: Diagnosis not present

## 2018-03-30 DIAGNOSIS — L600 Ingrowing nail: Secondary | ICD-10-CM | POA: Diagnosis not present

## 2018-03-30 NOTE — Progress Notes (Signed)
Subjective: 72 y.o. year old male patient presents complaining of painful nails. Patient requests toe nails trimmed.  Blood sugar stays between 101-126.  Objective: Dermatologic: Thick yellow deformed nails x 10. Vascular: Pedal pulses are all palpable. Orthopedic: No gross deformities. Neurologic: Positive for subjective numbness and pain in both feet.  Assessment: Dystrophic mycotic nails x 10. Peripheral Neuropathy.  Treatment: All mycotic nails debrided.  Return as needed.

## 2018-03-30 NOTE — Patient Instructions (Signed)
Seen for hypertrophic nails. All nails debrided. Return as needed.  

## 2019-06-22 ENCOUNTER — Emergency Department (HOSPITAL_COMMUNITY)
Admission: EM | Admit: 2019-06-22 | Discharge: 2019-06-22 | Disposition: A | Discharge status: Home / Self Care | Payer: No Typology Code available for payment source | Attending: Emergency Medicine | Admitting: Emergency Medicine

## 2019-06-22 ENCOUNTER — Emergency Department (HOSPITAL_COMMUNITY): Payer: No Typology Code available for payment source

## 2019-06-22 ENCOUNTER — Other Ambulatory Visit: Payer: Self-pay

## 2019-06-22 ENCOUNTER — Encounter (HOSPITAL_COMMUNITY): Payer: Self-pay | Admitting: Emergency Medicine

## 2019-06-22 DIAGNOSIS — Z7982 Long term (current) use of aspirin: Secondary | ICD-10-CM | POA: Insufficient documentation

## 2019-06-22 DIAGNOSIS — Z7984 Long term (current) use of oral hypoglycemic drugs: Secondary | ICD-10-CM | POA: Diagnosis not present

## 2019-06-22 DIAGNOSIS — Z79899 Other long term (current) drug therapy: Secondary | ICD-10-CM | POA: Insufficient documentation

## 2019-06-22 DIAGNOSIS — Y92240 Courthouse as the place of occurrence of the external cause: Secondary | ICD-10-CM | POA: Insufficient documentation

## 2019-06-22 DIAGNOSIS — Y999 Unspecified external cause status: Secondary | ICD-10-CM | POA: Diagnosis not present

## 2019-06-22 DIAGNOSIS — E119 Type 2 diabetes mellitus without complications: Secondary | ICD-10-CM | POA: Insufficient documentation

## 2019-06-22 DIAGNOSIS — I10 Essential (primary) hypertension: Secondary | ICD-10-CM | POA: Diagnosis not present

## 2019-06-22 DIAGNOSIS — Y9301 Activity, walking, marching and hiking: Secondary | ICD-10-CM | POA: Insufficient documentation

## 2019-06-22 DIAGNOSIS — S0101XA Laceration without foreign body of scalp, initial encounter: Secondary | ICD-10-CM | POA: Insufficient documentation

## 2019-06-22 DIAGNOSIS — W1830XA Fall on same level, unspecified, initial encounter: Secondary | ICD-10-CM | POA: Insufficient documentation

## 2019-06-22 DIAGNOSIS — Z23 Encounter for immunization: Secondary | ICD-10-CM | POA: Diagnosis not present

## 2019-06-22 DIAGNOSIS — I2510 Atherosclerotic heart disease of native coronary artery without angina pectoris: Secondary | ICD-10-CM | POA: Insufficient documentation

## 2019-06-22 LAB — CBG MONITORING, ED: Glucose-Capillary: 130 mg/dL — ABNORMAL HIGH (ref 70–99)

## 2019-06-22 MED ORDER — TRAMADOL HCL 50 MG PO TABS: 50.0000 mg | ORAL_TABLET | Freq: Four times a day (QID) | ORAL | 0 refills | Status: DC | PRN

## 2019-06-22 MED ORDER — TRAMADOL HCL 50 MG PO TABS
50.0000 mg | ORAL_TABLET | Freq: Once | ORAL | Status: AC
  Administered 2019-06-22: 50 mg via ORAL
  Filled 2019-06-22: qty 1

## 2019-06-22 MED ORDER — TETANUS-DIPHTH-ACELL PERTUSSIS 5-2.5-18.5 LF-MCG/0.5 IM SUSP
0.5000 mL | Freq: Once | INTRAMUSCULAR | Status: AC
  Administered 2019-06-22: 0.5 mL via INTRAMUSCULAR
  Filled 2019-06-22: qty 0.5

## 2019-06-22 NOTE — ED Triage Notes (Signed)
[place] and fell down four concrete steps. Patient has a laceration to the top left of his head. Patient states that he did not lose consciousness and states mild head and neck pain. Patient is no ton blood thinners.

## 2019-06-22 NOTE — ED Provider Notes (Signed)
[facility] EMERGENCY DEPARTMENT Provider Note   CSN: [id number] Arrival date & time: 06/22/19  1122     History Chief Complaint  Patient presents with  . Fall    [name] is a 73 y.o. male hx of CAD, DM, HTN, here presenting after a fall.  Patient states that he was at the court house trying to change his address walk at the court house and slipped and fell backwards.  He states that he did hit his head and had bleeding in his posterior scalp. Denies passing out or other injuries.  He is unclear when his last tetanus shot was.   The history is provided by the patient.       Past Medical History:  Diagnosis Date  . Coronary artery disease   . Diabetes mellitus   . Hypertension     Patient Active Problem List   Diagnosis Date Noted  . Onychomycosis 03/30/2017  . Peripheral neuropathy 03/24/2017    Past Surgical History:  Procedure Laterality Date  . BACK SURGERY    . CORONARY ARTERY BYPASS GRAFT         History reviewed. No pertinent family history.  Social History   Tobacco Use  . Smoking status: Never Smoker  . Smokeless tobacco: Never Used  Substance Use Topics  . Alcohol use: No  . Drug use: No    Home Medications Prior to Admission medications   Medication Sig Start Date End Date Taking? Authorizing Provider  amLODipine (NORVASC) 10 MG tablet Take 10 mg by mouth daily.    [provider]  aspirin EC 81 MG tablet Take 81 mg by mouth daily.    [provider]  buPROPion (ZYBAN) 150 MG 12 hr tablet Take 150 mg by mouth 2 (two) times daily.    [provider]  cetirizine (ZYRTEC) 10 MG tablet Take 10 mg by mouth daily.    [provider]  Cholecalciferol 1000 UNITS tablet Take 1,000 Units by mouth daily.    [provider]  donepezil (ARICEPT) 10 MG tablet Take 10 mg by mouth at bedtime.    [provider]  DULoxetine (CYMBALTA) 30 MG capsule Take 30 mg by mouth daily.     [provider]  ferrous sulfate 325 (65 FE) MG tablet Take 325 mg by mouth daily with breakfast.    [provider]  fexofenadine (ALLEGRA) 180 MG tablet Take 180 mg by mouth daily.    [provider]  fluticasone (VERAMYST) 27.5 MCG/SPRAY nasal spray Place 2 sprays into the nose daily.    [provider]  glipiZIDE (GLUCOTROL) 10 MG tablet Take 10 mg by mouth 2 (two) times daily before a meal.    [provider]  glucosamine-chondroitin (GLUCOSAMINE-CHONDROITIN DS) 500-400 MG tablet Take 1 tablet by mouth 2 (two) times daily.     [provider]  HYDROcodone-acetaminophen (VICODIN) 5-500 MG per tablet Take 1 tablet by mouth every 6 (six) hours as needed. For pain    [provider]  insulin glargine (LANTUS) 100 UNIT/ML injection Inject 16 Units into the skin at bedtime.    [provider]  lisinopril (PRINIVIL,ZESTRIL) 40 MG tablet Take 20 mg by mouth daily.    [provider]  losartan (COZAAR) 100 MG tablet Take 100 mg by mouth daily.    [provider]  meclizine (ANTIVERT) 25 MG tablet Take 25 mg by mouth 3 (three) times daily as needed for dizziness.  [provider]  memantine (NAMENDA) 10 MG tablet Take 10 mg by mouth 2 (two) times daily.    [provider]  metFORMIN (GLUCOPHAGE) 1000 MG tablet Take 1,000 mg by mouth 2 (two) times daily with a meal.    [provider]  montelukast (SINGULAIR) 10 MG tablet Take 10 mg by mouth at bedtime.    [provider]  Multiple Vitamin (MULITIVITAMIN WITH MINERALS) TABS Take 1 tablet by mouth daily.    [provider]  niacin 500 MG tablet Take 500 mg by mouth 2 (two) times daily with a meal.    [provider]  Omega-3 Fatty Acids (FISH OIL) 1000 MG CAPS Take by mouth.    [provider]  omeprazole (PRILOSEC) 20 MG capsule Take 20 mg by mouth daily.    [provider]  OXcarbazepine  (TRILEPTAL) 150 MG tablet Take 150 mg by mouth 2 (two) times daily.    [provider]  pravastatin (PRAVACHOL) 20 MG tablet Take 20 mg by mouth daily.    [provider]  propranolol (INDERAL) 10 MG tablet Take 10 mg by mouth 3 (three) times daily.    [provider]  ranitidine (ZANTAC) 150 MG tablet Take 150 mg by mouth 2 (two) times daily.    [provider]  ranolazine (RANEXA) 500 MG 12 hr tablet Take 500 mg by mouth 2 (two) times daily.    [provider]  tamsulosin (FLOMAX) 0.4 MG CAPS capsule Take 0.4 mg by mouth.    [provider]    Allergies    Methocarbamol, Oxycodone-acetaminophen, and Tape  Review of Systems   Review of Systems  Skin: Positive for wound.  All other systems reviewed and are negative.   Physical Exam Updated Vital Signs BP 129/64   Pulse (!) 49   Temp 98.3 F (36.8 C) (Oral)   Resp 18   SpO2 95%   Physical Exam Vitals and nursing note reviewed.  HENT:     Head: Normocephalic.     Comments: 4 cm laceration posterior scalp with active oozing.     Nose: Nose normal.     Mouth/Throat:     Mouth: Mucous membranes are moist.  Eyes:     Extraocular Movements: Extraocular movements intact.     Pupils: Pupils are equal, round, and reactive to light.  Cardiovascular:     Rate and Rhythm: Normal rate and regular rhythm.     Pulses: Normal pulses.     Heart sounds: Normal heart sounds.  Pulmonary:     Effort: Pulmonary effort is normal.     Breath sounds: Normal breath sounds.  Abdominal:     General: Abdomen is flat.     Palpations: Abdomen is soft.  Musculoskeletal:        General: Normal range of motion.     Cervical back: Normal range of motion.     Comments: No spinal tenderness, no obvious extremity trauma   Skin:    General: Skin is warm.     Capillary Refill: Capillary refill takes less than 2 seconds.  Neurological:     General: No focal deficit present.     Mental Status: He is  alert and oriented to person, place, and time.     Comments: CN 2- 12 intact, nl strength and sensation throughout   Psychiatric:        Mood and Affect: Mood normal.        Behavior: Behavior normal.  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 EXAM: CT HEAD WITHOUT CONTRAST CT CERVICAL SPINE WITHOUT CONTRAST TECHNIQUE: Multidetector CT imaging of the head and cervical spine was performed following the standard protocol without intravenous contrast. Multiplanar CT image reconstructions of the cervical spine were also generated. COMPARISON:  None. FINDINGS: CT HEAD FINDINGS Brain: The ventricles and sulci appropriate size for patient's age. Mild periventricular and deep white matter chronic microvascular ischemic changes noted. There is no acute intracranial hemorrhage. No mass effect or midline shift. No extra-axial fluid collection. Vascular: No hyperdense vessel or unexpected calcification. Skull: Normal. Negative for fracture or focal lesion. Sinuses/Orbits: Small left maxillary sinus retention cyst or polyp. The remainder of the visualized paranasal sinuses and mastoid air cells are clear. Other: Small left posterior scalp contusion and skin staples. CT CERVICAL SPINE FINDINGS Alignment: No  acute subluxation. Grade 1 C3-C4 anterolisthesis. Skull base and vertebrae: No acute fracture identified. There is a partially healed old-appearing nondisplaced fracture of the base of the odontoid. Old-appearing fracture of the inferior aspect of the anterior ring of C1 is also noted. There is posterior fusion at C1-C2. Soft tissues and spinal canal: No prevertebral fluid or swelling. No visible canal hematoma. Disc levels: Multilevel degenerative changes with osteophyte and vacuum phenomena. Upper chest: Negative. Other: Atherosclerotic calcification of the aortic arch. IMPRESSION: 1. No acute intracranial hemorrhage. Mild age-related atrophy and chronic microvascular ischemic changes. 2. No acute/traumatic cervical spine pathology. 3. Old appearing fractures of the base of the odontoid and anterior ring of C1, new since the CT of 03/13/2018. Correlation with clinical exam and history of known prior fracture recommended. 4. C1-C2 posterior fusion. Electronically Signed   By: Anner Crete M.D.   On: 06/22/2019 14:00   CT Cervical Spine Wo Contrast  Result Date: 06/22/2019 CLINICAL DATA:  73 year old male with head trauma. EXAM: CT HEAD WITHOUT CONTRAST CT CERVICAL SPINE WITHOUT CONTRAST TECHNIQUE: Multidetector CT imaging of the head and cervical spine was performed following the standard protocol without intravenous contrast. Multiplanar CT image reconstructions of the cervical spine were also generated. COMPARISON:  None. FINDINGS: CT HEAD FINDINGS Brain: The ventricles and sulci appropriate size for patient's age. Mild periventricular and deep white matter chronic microvascular ischemic changes noted. There is no acute intracranial hemorrhage. No mass effect or midline shift. No extra-axial fluid collection. Vascular: No hyperdense vessel or unexpected calcification. Skull: Normal. Negative for fracture or focal lesion. Sinuses/Orbits: Small left maxillary sinus retention cyst or polyp. The remainder of  the visualized paranasal sinuses and mastoid air cells are clear. Other: Small left posterior scalp contusion and skin staples. CT CERVICAL SPINE FINDINGS Alignment: No acute subluxation. Grade 1 C3-C4 anterolisthesis. Skull base and vertebrae: No acute fracture identified. There is a partially  healed old-appearing nondisplaced fracture of the base of the odontoid. Old-appearing fracture of the inferior aspect of the anterior ring of C1 is also noted. There is posterior fusion at C1-C2. Soft tissues and spinal canal: No prevertebral fluid or swelling. No visible canal hematoma. Disc levels: Multilevel degenerative changes with osteophyte and vacuum phenomena. Upper chest: Negative. Other: Atherosclerotic calcification of the aortic arch. IMPRESSION: 1. No acute intracranial hemorrhage. Mild age-related atrophy and chronic microvascular ischemic changes. 2. No acute/traumatic cervical spine pathology. 3. Old appearing fractures of the base of the odontoid and anterior ring of C1, new since the CT of 03/13/2018. Correlation with clinical exam and history of known prior fracture recommended. 4. C1-C2 posterior fusion. Electronically Signed   By: Elgie Collard M.D.   On: 06/22/2019 14:00    Procedures Procedures (including critical care time)  LACERATION REPAIR Performed by: Richardean Canal Authorized by: Richardean Canal Consent: Verbal consent obtained. Risks and benefits: risks, benefits and alternatives were discussed Consent given by: patient Patient identity confirmed: provided demographic data Prepped and Draped in normal sterile fashion Wound explored  Laceration Location: posterior scalp   Laceration Length: 4 cm  No Foreign Bodies seen or palpated  Anesthesia: local infiltration  Local anesthetic: none  Anesthetic total: none  Irrigation method: syringe Amount of cleaning: standard  Skin closure: staples  Number of staples: 6  Technique: staple   Patient tolerance: Patient  tolerated the procedure well with no immediate complications.   Medications Ordered in ED Medications  traMADol (ULTRAM) tablet 50 mg (50 mg Oral Given 06/22/19 1252)  Tdap (BOOSTRIX) injection 0.5 mL (0.5 mLs Intramuscular Given 06/22/19 1227)    ED Course  I have reviewed the triage vital signs and the nursing notes.  Pertinent labs & imaging results that were available during my care of the patient were reviewed by me and considered in my medical decision making (see chart for details).    MDM Rules/Calculators/A&P                      [name] is a 73 y.o. male here with fall with posterior scalp laceration. Patient had mechanical fall. Will get CT head/neck. Will update tdap and staple laceration.   2:10 PM CT head/neck showed no bleed, there are old fractures. Stable for discharge. Staple removal in a week.    Final Clinical Impression(s) / ED Diagnoses Final diagnoses:  None    Rx / DC Orders ED Discharge Orders    None       Charlynne Pander, MD 06/22/19 [phone]

## 2019-06-22 NOTE — Discharge Instructions (Addendum)
Take tylenol for pain   Take tramadol for worse pain   Staple removal in a week   See your doctor  Return to ER if you have worse headaches, vomiting, fever.

## 2019-10-28 ENCOUNTER — Other Ambulatory Visit: Payer: Self-pay | Admitting: Orthopedic Surgery

## 2019-10-28 ENCOUNTER — Telehealth: Payer: Self-pay

## 2019-10-28 DIAGNOSIS — S12150A Other traumatic displaced spondylolisthesis of second cervical vertebra, initial encounter for closed fracture: Secondary | ICD-10-CM

## 2019-10-28 NOTE — Telephone Encounter (Signed)
Spoke with patient to review his medications and drug allergies before getting him scheduled for a myelogram.  He was informed he will be here for two hours, will need a driver and will need to be on strict bedrest for 24 hours after the procedure.  He also was informed he will need to hold Duloxetine/Cymbalta for 48 hours before, and 24 hours after, the myelogram.

## 2019-11-08 ENCOUNTER — Other Ambulatory Visit: Payer: Self-pay

## 2019-11-08 ENCOUNTER — Ambulatory Visit
Admission: RE | Admit: 2019-11-08 | Discharge: 2019-11-08 | Disposition: A | Discharge status: Home / Self Care | Payer: Medicare Other | Source: Ambulatory Visit | Attending: Orthopedic Surgery | Admitting: Orthopedic Surgery

## 2019-11-08 DIAGNOSIS — S12150A Other traumatic displaced spondylolisthesis of second cervical vertebra, initial encounter for closed fracture: Secondary | ICD-10-CM

## 2019-11-08 MED ORDER — DIAZEPAM 5 MG PO TABS
5.0000 mg | ORAL_TABLET | Freq: Once | ORAL | Status: AC
  Administered 2019-11-08: 5 mg via ORAL

## 2019-11-08 MED ORDER — ONDANSETRON HCL 4 MG/2ML IJ SOLN: 4.0000 mg | Freq: Four times a day (QID) | INTRAMUSCULAR | Status: DC | PRN

## 2019-11-08 MED ORDER — IOPAMIDOL (ISOVUE-M 300) INJECTION 61%
10.0000 mL | Freq: Once | INTRAMUSCULAR | Status: AC | PRN
  Administered 2019-11-08: 10 mL via INTRATHECAL

## 2019-11-08 NOTE — Progress Notes (Signed)
Patient states he has been off Duloxetine for at least the past two days. 

## 2019-11-08 NOTE — Discharge Instructions (Signed)
Myelogram Discharge Instructions  1. Go home and rest quietly for the next 24 hours.  It is important to lie flat for the next 24 hours.  Get up only to go to the restroom.  You may lie in the bed or on a couch on your back, your stomach, your left side or your right side.  You may have one pillow under your head.  You may have pillows between your knees while you are on your side or under your knees while you are on your back.  2. DO NOT drive today.  Recline the seat as far back as it will go, while still wearing your seat belt, on the way home.  3. You may get up to go to the bathroom as needed.  You may sit up for 10 minutes to eat.  You may resume your normal diet and medications unless otherwise indicated.  Drink plenty of extra fluids today and tomorrow.  4. The incidence of a spinal headache with nausea and/or vomiting is about 5% (one in 20 patients).  If you develop a headache, lie flat and drink plenty of fluids until the headache goes away.  Caffeinated beverages may be helpful.  If you develop severe nausea and vomiting or a headache that does not go away with flat bed rest, call [phone].  5. You may resume normal activities after your 24 hours of bed rest is over; however, do not exert yourself strongly or do any heavy lifting tomorrow.  6. Call your physician for a follow-up appointment.    You may resume Duloxetine on Tuesday, Nov 09, 2019 after 9:30a.m.

## 2021-01-12 NOTE — Progress Notes (Signed)
Sent message, via epic in basket, requesting orders in epic from surgeon.  

## 2021-01-17 NOTE — H&P (Signed)
Patient's anticipated LOS is less than 2 midnights, meeting these requirements: - Younger than 52 - Lives within 1 hour of care - Has a competent adult at home to recover with post-op recover - NO history of  - Chronic pain requiring opiods  - Diabetes  - Coronary Artery Disease  - Heart failure  - Heart attack  - Stroke  - DVT/VTE  - Cardiac arrhythmia  - Respiratory Failure/COPD  - Renal failure  - Anemia  - Advanced Liver disease     [name] is an 75 y.o. male.    Chief Complaint: right knee pain  HPI: Pt is a 75 y.o. male complaining of right knee pain for multiple years. Pain had continually increased since the beginning. X-rays in the clinic show end-stage arthritic changes of the right knee. Pt has tried various conservative treatments which have failed to alleviate their symptoms, including injections and therapy. Various options are discussed with the patient. Risks, benefits and expectations were discussed with the patient. Patient understand the risks, benefits and expectations and wishes to proceed with surgery.   PCP:  Patient, No Pcp Per (Inactive)  D/C Plans: Home  PMH: Past Medical History:  Diagnosis Date   Coronary artery disease    Diabetes mellitus    Hypertension     PSH: Past Surgical History:  Procedure Laterality Date   BACK SURGERY     CORONARY ARTERY BYPASS GRAFT      Social History:  reports that he has never smoked. He has never used smokeless tobacco. He reports that he does not drink alcohol and does not use drugs.  Allergies:  Allergies  Allergen Reactions   Methocarbamol Other (See Comments)    Hallucinations.  Took with Percocet, so not sure which medication caused this, if not the combination of the two.    Oxycodone-Acetaminophen Other (See Comments)    Hallucinations.  Took with Methocarbamol, so not sure which medication caused this, if not the combination of the two.    Tape Rash    AT SITE, HAS DONE WELL WITH  SURGICAL TAPE    Medications: No current facility-administered medications for this encounter.   Current Outpatient Medications  Medication Sig Dispense Refill   amLODipine (NORVASC) 10 MG tablet Take 10 mg by mouth daily.     aspirin EC 81 MG tablet Take 81 mg by mouth daily.     cetirizine (ZYRTEC) 10 MG tablet Take 10 mg by mouth daily.     Cholecalciferol 1000 UNITS tablet Take 1,000 Units by mouth daily.     donepezil (ARICEPT) 10 MG tablet Take 10 mg by mouth at bedtime.     DULoxetine (CYMBALTA) 30 MG capsule Take 30 mg by mouth daily.     ferrous sulfate 325 (65 FE) MG tablet Take 325 mg by mouth daily with breakfast.     fexofenadine (ALLEGRA) 180 MG tablet Take 180 mg by mouth daily.     glipiZIDE (GLUCOTROL) 10 MG tablet Take 10 mg by mouth 2 (two) times daily before a meal.     glucosamine-chondroitin (GLUCOSAMINE-CHONDROITIN DS) 500-400 MG tablet Take 1 tablet by mouth 2 (two) times daily.      HYDROcodone-acetaminophen (VICODIN) 5-500 MG per tablet Take 1 tablet by mouth every 6 (six) hours as needed. For pain     insulin glargine (LANTUS) 100 UNIT/ML injection Inject 16 Units into the skin at bedtime.     losartan (COZAAR) 100 MG tablet Take 100 mg by mouth daily.  memantine (NAMENDA) 10 MG tablet Take 10 mg by mouth 2 (two) times daily.     metFORMIN (GLUCOPHAGE) 1000 MG tablet Take 1,000 mg by mouth 2 (two) times daily with a meal.     Omega-3 Fatty Acids (FISH OIL) 1000 MG CAPS Take by mouth.     omeprazole (PRILOSEC) 20 MG capsule Take 20 mg by mouth daily.     OXcarbazepine (TRILEPTAL) 150 MG tablet Take 150 mg by mouth 2 (two) times daily.     pravastatin (PRAVACHOL) 20 MG tablet Take 20 mg by mouth daily.     ranolazine (RANEXA) 500 MG 12 hr tablet Take 500 mg by mouth 2 (two) times daily.     tamsulosin (FLOMAX) 0.4 MG CAPS capsule Take 0.4 mg by mouth.      No results found for this or any previous visit (from the past 48 hour(s)). No results  found.  ROS: Pain with rom of the right lower extremity  Physical Exam: Alert and oriented 75 y.o. male in no acute distress Cranial nerves 2-12 intact Cervical spine: full rom with no tenderness, nv intact distally Chest: active breath sounds bilaterally, no wheeze rhonchi or rales Heart: regular rate and rhythm, no murmur Abd: non tender non distended with active bowel sounds Hip is stable with rom  Right knee with medial and lateral tenderness Crepitus with rom Antalgic gait  Assessment/Plan Assessment: right knee end stage osteoarthritis   Plan:   Patient will undergo a right knee replacment by Dr. Ranell Patrick at [facility]  Risks benefits and expectations were discussed with the patient. Patient understand risks, benefits and expectations and wishes to proceed. Preoperative templating of the joint replacement has been completed, documented, and submitted to the Operating Room personnel in order to optimize intra-operative equipment management.   Alphonsa Overall PA-C, MPAS [facility] is now [employer] [address], [place], [place] [address] Phone: [phone] [email or web address] Facebook  [employer]

## 2021-01-23 NOTE — Progress Notes (Signed)
DUE TO COVID-19 ONLY ONE VISITOR IS ALLOWED TO COME WITH YOU AND STAY IN THE WAITING ROOM ONLY DURING PRE OP AND PROCEDURE DAY OF SURGERY.  2 VISITOR  MAY VISIT WITH YOU AFTER SURGERY IN YOUR PRIVATE ROOM DURING VISITING HOURS ONLY!  YOU NEED TO HAVE A COVID 19 TEST ON___8/02/2021 ___@_  @_from  8am-3pm _____, THIS TEST MUST BE DONE BEFORE SURGERY,  Covid test is done at [address] [place], [place] [address].  This is a drive thru.  No appt required. Please see map.                 Your procedure is scheduled on:     02/02/2021   Report to [facility] Main  Entrance   Report to admitting at     0730AM     Call this number if you have problems the morning of surgery [phone]    REMEMBER: NO  SOLID FOOD CANDY OR GUM AFTER MIDNIGHT. CLEAR LIQUIDS UNTIL     0700am       . NOTHING BY MOUTH EXCEPT CLEAR LIQUIDS UNTIL    0700am    . PLEASE FINISH ENSURE DRINK PER SURGEON ORDER  WHICH NEEDS TO BE COMPLETED AT  0700am     .      CLEAR LIQUID DIET   Foods Allowed                                                                    Coffee and tea, regular and decaf                            Fruit ices (not with fruit pulp)                                      Iced Popsicles                                    Carbonated beverages, regular and diet                                    Cranberry, grape and apple juices Sports drinks like Gatorade Lightly seasoned clear broth or consume(fat free) Sugar, honey syrup ___________________________________________________________________      BRUSH YOUR TEETH MORNING OF SURGERY AND RINSE YOUR MOUTH OUT, NO CHEWING GUM CANDY OR MINTS.     Take these medicines the morning of surgery with A SIP OF WATER:  flomax, ranexa, trileptal, prilosec, allegra, amlodipione, zyrtec, cymbalta   Take 1/2 dose of Lantus Insuilin nite before surgery.   DO NOT TAKE ANY DIABETIC MEDICATIONS DAY OF YOUR SURGERY                               You may  not have any metal on your body including hair pins and              piercings  Do not wear jewelry, make-up, lotions, powders or perfumes, deodorant             Do not wear nail polish on your fingernails.  Do not shave  48 hours prior to surgery.              Men may shave face and neck.   Do not bring valuables to the hospital. [facility] IS NOT             RESPONSIBLE   FOR VALUABLES.  Contacts, dentures or bridgework may not be worn into surgery.  Leave suitcase in the car. After surgery it may be brought to your room.     Patients discharged the day of surgery will not be allowed to drive home. IF YOU ARE HAVING SURGERY AND GOING HOME THE SAME DAY, YOU MUST HAVE AN ADULT TO DRIVE YOU HOME AND BE WITH YOU FOR 24 HOURS. YOU MAY GO HOME BY TAXI OR UBER OR ORTHERWISE, BUT AN ADULT MUST ACCOMPANY YOU HOME AND STAY WITH YOU FOR 24 HOURS.  Name and phone number of your driver:  Special Instructions: N/A              Please read over the following fact sheets you were given: _____________________________________________________________________  [facility] - Preparing for Surgery Before surgery, you can play an important role.  Because skin is not sterile, your skin needs to be as free of germs as possible.  You can reduce the number of germs on your skin by washing with CHG (chlorahexidine gluconate) soap before surgery.  CHG is an antiseptic cleaner which kills germs and bonds with the skin to continue killing germs even after washing. Please DO NOT use if you have an allergy to CHG or antibacterial soaps.  If your skin becomes reddened/irritated stop using the CHG and inform your nurse when you arrive at Short Stay. Do not shave (including legs and underarms) for at least 48 hours prior to the first CHG shower.  You may shave your face/neck. Please follow these instructions carefully:  1.  Shower with CHG Soap the night before surgery and the  morning of Surgery.  2.  If you choose to wash  your hair, wash your hair first as usual with your  normal  shampoo.  3.  After you shampoo, rinse your hair and body thoroughly to remove the  shampoo.                           4.  Use CHG as you would any other liquid soap.  You can apply chg directly  to the skin and wash                       Gently with a scrungie or clean washcloth.  5.  Apply the CHG Soap to your body ONLY FROM THE NECK DOWN.   Do not use on face/ open                           Wound or open sores. Avoid contact with eyes, ears mouth and genitals (private parts).                       Wash face,  Genitals (private parts) with your normal soap.             6.  Wash  thoroughly, paying special attention to the area where your surgery  will be performed.  7.  Thoroughly rinse your body with warm water from the neck down.  8.  DO NOT shower/wash with your normal soap after using and rinsing off  the CHG Soap.                9.  Pat yourself dry with a clean towel.            10.  Wear clean pajamas.            11.  Place clean sheets on your bed the night of your first shower and do not  sleep with pets. Day of Surgery : Do not apply any lotions/deodorants the morning of surgery.  Please wear clean clothes to the hospital/surgery center.  FAILURE TO FOLLOW THESE INSTRUCTIONS MAY RESULT IN THE CANCELLATION OF YOUR SURGERY PATIENT SIGNATURE_________________________________  NURSE SIGNATURE__________________________________  ________________________________________________________________________

## 2021-01-25 ENCOUNTER — Encounter (HOSPITAL_COMMUNITY): Payer: Self-pay

## 2021-01-25 ENCOUNTER — Encounter (HOSPITAL_COMMUNITY)
Admission: RE | Admit: 2021-01-25 | Discharge: 2021-01-25 | Disposition: A | Discharge status: Home / Self Care | Payer: Medicare Other | Source: Ambulatory Visit | Attending: Orthopedic Surgery | Admitting: Orthopedic Surgery

## 2021-01-25 ENCOUNTER — Other Ambulatory Visit: Payer: Self-pay

## 2021-01-25 DIAGNOSIS — Z79899 Other long term (current) drug therapy: Secondary | ICD-10-CM | POA: Insufficient documentation

## 2021-01-25 DIAGNOSIS — I2510 Atherosclerotic heart disease of native coronary artery without angina pectoris: Secondary | ICD-10-CM | POA: Insufficient documentation

## 2021-01-25 DIAGNOSIS — Z7984 Long term (current) use of oral hypoglycemic drugs: Secondary | ICD-10-CM | POA: Diagnosis not present

## 2021-01-25 DIAGNOSIS — E119 Type 2 diabetes mellitus without complications: Secondary | ICD-10-CM | POA: Diagnosis not present

## 2021-01-25 DIAGNOSIS — Z794 Long term (current) use of insulin: Secondary | ICD-10-CM | POA: Diagnosis not present

## 2021-01-25 DIAGNOSIS — Z951 Presence of aortocoronary bypass graft: Secondary | ICD-10-CM | POA: Diagnosis not present

## 2021-01-25 DIAGNOSIS — M1711 Unilateral primary osteoarthritis, right knee: Secondary | ICD-10-CM | POA: Insufficient documentation

## 2021-01-25 DIAGNOSIS — Z01812 Encounter for preprocedural laboratory examination: Secondary | ICD-10-CM | POA: Insufficient documentation

## 2021-01-25 DIAGNOSIS — I10 Essential (primary) hypertension: Secondary | ICD-10-CM | POA: Diagnosis not present

## 2021-01-25 DIAGNOSIS — I252 Old myocardial infarction: Secondary | ICD-10-CM | POA: Diagnosis not present

## 2021-01-25 HISTORY — DX: Acute myocardial infarction, unspecified: I21.9

## 2021-01-25 HISTORY — DX: Headache, unspecified: R51.9

## 2021-01-25 HISTORY — DX: Other amnesia: R41.3

## 2021-01-25 HISTORY — DX: Unspecified osteoarthritis, unspecified site: M19.90

## 2021-01-25 HISTORY — DX: Sleep apnea, unspecified: G47.30

## 2021-01-25 HISTORY — DX: Cardiac murmur, unspecified: R01.1

## 2021-01-25 LAB — CBC
HCT: 48.6 % (ref 39.0–52.0)
Hemoglobin: 15.7 g/dL (ref 13.0–17.0)
MCH: 30 pg (ref 26.0–34.0)
MCHC: 32.3 g/dL (ref 30.0–36.0)
MCV: 92.9 fL (ref 80.0–100.0)
Platelets: 215 10*3/uL (ref 150–400)
RBC: 5.23 MIL/uL (ref 4.22–5.81)
RDW: 16.1 % — ABNORMAL HIGH (ref 11.5–15.5)
WBC: 10.3 10*3/uL (ref 4.0–10.5)
nRBC: 0 % (ref 0.0–0.2)

## 2021-01-25 LAB — BASIC METABOLIC PANEL
Anion gap: 9 (ref 5–15)
BUN: 16 mg/dL (ref 8–23)
CO2: 24 mmol/L (ref 22–32)
Calcium: 9.1 mg/dL (ref 8.9–10.3)
Chloride: 104 mmol/L (ref 98–111)
Creatinine, Ser: 0.78 mg/dL (ref 0.61–1.24)
GFR, Estimated: >60 mL/min (ref >60)
Glucose, Bld: 151 mg/dL — ABNORMAL HIGH (ref 70–99)
Potassium: 4.1 mmol/L (ref 3.5–5.1)
Sodium: 137 mmol/L (ref 135–145)

## 2021-01-25 LAB — SURGICAL PCR SCREEN
MRSA, PCR: NEGATIVE
Staphylococcus aureus: NEGATIVE

## 2021-01-25 LAB — GLUCOSE, CAPILLARY: Glucose-Capillary: 151 mg/dL — ABNORMAL HIGH (ref 70–99)

## 2021-01-25 NOTE — Progress Notes (Addendum)
Anesthesia Review:  [facility]: [facility] Link- [facility] [place]  Cardiologist : DR cheek in [facility] and DR Hyacinth Meeker at [facility] in [place]   DR Julieanne Manson- clearance on chart dasted 12/15/20.  08/03/20- Cardiology clinic note on chart  Have requested any info from [place] at [facility]  Ortho   Have requested by fax most recent office visit notes, ekg, labs, stress and echo from [place] [place] by fax.  Rerequested info from [place] on 01/30/21.   Chest x-ray : EKG : Echo : 01/2020 awaiting report  Stress test: 01/2020 awaiting report  Cardiac Cath :  Activity level: can do a flight of stairs without diffculty  Sleep Study/ CPAP : yes  Fasting Blood Sugar :      / Checks Blood Sugar -- times a day:   Blood Thinner/ Instructions /Last Dose: ASA / Instructions/ Last Dose :   Dm- type 2 - checks glucose once daily per pt  Hgba1c- 81 mg Aspirin - pt states he has received no instructions - Instructed pt to call surgeon and/or cardiology regarding preop instructoins of Aspirin.  PT voiced understanding.   Wife present with pt at preop appt.  PT has memory issues at times.  On aricept and Namenda.   Reviewed meds with wife at preop since she stated some meds discontinued.  Wife brought in med list.  Reviewed but some of meds on list wife stated had been discontinued then she stated he was taking.  Reviewed entire list and some meds were missing dosages and times.  Informed wife that she would receive a call and that meds would be reviewed to clarify dosages and times.  Called and spoke with [facility].  List given to pharmacy.  Pharm Tech to call wife and verify meds.  Pharm tech called wife per pt and reconciled meds.  Wife currently not at home .  Plan to call wife on cell phone on 01/26/21 to let her know what meds pt is to take am of surgetry.

## 2021-01-26 NOTE — Progress Notes (Signed)
LVMM for wife to call in regards to what meds pt is to take day of surgery since meds have been reconciled in epic.  Asked wife to call nurse back at [phone].

## 2021-01-29 ENCOUNTER — Other Ambulatory Visit: Payer: Self-pay | Admitting: Orthopedic Surgery

## 2021-01-29 NOTE — Progress Notes (Signed)
LVMM for wife, [name] , to call nurse back in regards to preop instructions for meds prior to surgery.

## 2021-01-30 LAB — SARS CORONAVIRUS 2 (TAT 6-24 HRS): SARS Coronavirus 2: NEGATIVE

## 2021-01-30 NOTE — Progress Notes (Signed)
Attempted to call wife again today to review meds and they were going out door to MD appt and will return at approx 430pm.

## 2021-01-30 NOTE — Progress Notes (Signed)
Spoke with pt's wife, Krishan Mcbreen and reviewed entire meds in epic. And verified dosage and times of day pt took meds.  Pt's wife was instructed for pt to take the following Meds am of surgery:  Amlodipine, clonidine, cymbalta, lexapro, meclizine if needed, trospium, propanolol, ranexa, flomax , omeprazole, trileptal .   Wife was reinforced for pt to eat a good bedtime snack the nite before surgery to either be peanut butter crackers, cheese crackers or [place] sandwich.  Wife voiced understanding.  Wife was instructed to take no Jardiance day before surgery and none day of surgeyr.  Wife was instructed No Glucotrol for supper dose day before surgery and none day of surgery.  Wife was instructed for pt to take 1/2 dose of Lantus Insulin nite before surgery.  Wife was instructed for pt to take no diabetic meds am of surgery. . Wife voiced understanding on all of above.

## 2021-01-31 NOTE — Anesthesia Preprocedure Evaluation (Addendum)
Anesthesia Evaluation  Patient identified by MRN, date of birth, ID band Patient awake    Reviewed: Allergy & Precautions, NPO status , Patient's Chart, lab work & pertinent test results, reviewed documented beta blocker date and time   Airway Mallampati: III  TM Distance: >3 FB Neck ROM: Full    Dental  (+) Teeth Intact, Dental Advisory Given, Caps   Pulmonary sleep apnea and Continuous Positive Airway Pressure Ventilation ,    Pulmonary exam normal breath sounds clear to auscultation       Cardiovascular hypertension, Pt. on medications and Pt. on home beta blockers + CAD, + Past MI and + CABG  Normal cardiovascular exam Rhythm:Regular Rate:Normal     Neuro/Psych  Headaches, S/p ACDF, lumbar fusions   Neuromuscular disease negative psych ROS   GI/Hepatic Neg liver ROS, GERD  Medicated,  Endo/Other  diabetes, Type 2, Oral Hypoglycemic Agents, Insulin DependentObesity   Renal/GU negative Renal ROS     Musculoskeletal  (+) Arthritis ,   Abdominal   Peds  Hematology negative hematology ROS (+) Plt 215k   Anesthesia Other Findings Day of surgery medications reviewed with the patient.  Reproductive/Obstetrics                           Anesthesia Physical Anesthesia Plan  ASA: 3  Anesthesia Plan: General   Post-op Pain Management:  Regional for Post-op pain   Induction: Intravenous  PONV Risk Score and Plan: 2 and Propofol infusion, Dexamethasone and Ondansetron  Airway Management Planned: Oral ETT  Additional Equipment:   Intra-op Plan:   Post-operative Plan:   Informed Consent: I have reviewed the patients History and Physical, chart, labs and discussed the procedure including the risks, benefits and alternatives for the proposed anesthesia with the patient or authorized representative who has indicated his/her understanding and acceptance.     Dental advisory given  Plan  Discussed with: CRNA, Anesthesiologist and Surgeon  Anesthesia Plan Comments: (See PAT note 01/25/2021, Jodell Cipro, PA-C)      Anesthesia Quick Evaluation

## 2021-01-31 NOTE — Progress Notes (Signed)
Anesthesia Chart Review   Case: [id number] Date/Time: 02/02/21 0949   Procedure: TOTAL KNEE ARTHROPLASTY (Right: Knee)   Anesthesia type: Spinal   Pre-op diagnosis: right knee end stage osteoarthritis   Location: [facility] ROOM 06 / [facility]   Surgeons: Beverely Low, MD       DISCUSSION:75 y.o. never smoker with h/o HTN, DM II (A1C 7.4 01/02/2021), CAD ([place] 1992 with PTCA, CABG x4 2004), right knee OA scheduled for above procedure 02/02/2021 with Dr. Beverely Low.   Clearance from cardiologist on chart which states pt is low risk for planned procedure.  Last seen by cardiology 08/03/20.  VS: BP 132/66   Pulse 68   Temp 37.2 C (Oral)   Ht 5' 6.5" (1.689 m)   Wt 106.1 kg   SpO2 96%   BMI 37.20 kg/m   PROVIDERS: Clinic, [facility]   LABS: Labs reviewed: Acceptable for surgery. (all labs ordered are listed, but only abnormal results are displayed)  Labs Reviewed  BASIC METABOLIC PANEL - Abnormal; Notable for the following components:      Result Value   Glucose, Bld 151 (*)    All other components within normal limits  CBC - Abnormal; Notable for the following components:   RDW 16.1 (*)    All other components within normal limits  GLUCOSE, CAPILLARY - Abnormal; Notable for the following components:   Glucose-Capillary 151 (*)    All other components within normal limits  SURGICAL PCR SCREEN     IMAGES:   EKG:   CV: Stress Test 01/2020 Inferior wall abnormality which may represent chronic infarct versus attenuation artifact. No evidence of inducible ischemic change. EF 54%  Echo 01/2020 There is mild concentric left ventricular hypertrophy Ejection Fraction = 50-55% Grade I diastolic dysfunction The left atrium is mildly dilated  Past Medical History:  Diagnosis Date   Arthritis    Coronary artery disease    Diabetes mellitus    Headache    Heart murmur    as a child   Hypertension    Memory deficit    pm aroce[t and namenda alert and oriented  has memory  issues at times   Myocardial infarction [facility])    Sleep apnea     Past Surgical History:  Procedure Laterality Date   BACK SURGERY     CERVICAL DISC SURGERY     CORONARY ARTERY BYPASS GRAFT      MEDICATIONS:  escitalopram (LEXAPRO) 10 MG tablet   meclizine (ANTIVERT) 25 MG tablet   montelukast (SINGULAIR) 10 MG tablet   Multiple Vitamins-Minerals (MULTIVITAMIN WITH MINERALS) tablet   omeprazole (PRILOSEC) 20 MG capsule   trospium (SANCTURA) 20 MG tablet   amLODipine (NORVASC) 10 MG tablet   aspirin EC 81 MG tablet   Cholecalciferol 1000 UNITS tablet   cloNIDine (CATAPRES) 0.1 MG tablet   DULoxetine (CYMBALTA) 30 MG capsule   empagliflozin (JARDIANCE) 25 MG TABS tablet   ferrous sulfate 325 (65 FE) MG tablet   glipiZIDE (GLUCOTROL) 10 MG tablet   Glucosamine-Chondroitin (COSAMIN DS PO)   insulin glargine (LANTUS) 100 UNIT/ML injection   losartan (COZAAR) 100 MG tablet   memantine (NAMENDA) 10 MG tablet   metFORMIN (GLUCOPHAGE) 1000 MG tablet   OXcarbazepine (TRILEPTAL) 150 MG tablet   pravastatin (PRAVACHOL) 80 MG tablet   propranolol (INDERAL) 10 MG tablet   ranolazine (RANEXA) 500 MG 12 hr tablet   tamsulosin (FLOMAX) 0.4 MG CAPS capsule   Testosterone 20.25 MG/1.25GM (1.62%) GEL  No current facility-administered medications for this encounter.   Jodell Cipro, PA-C [facility] Pre-Surgical Testing [phone]

## 2021-02-01 MED ORDER — BUPIVACAINE LIPOSOME 1.3 % IJ SUSP
20.0000 mL | Freq: Once | INTRAMUSCULAR | Status: DC
  Filled 2021-02-01: qty 20

## 2021-02-02 ENCOUNTER — Encounter (HOSPITAL_COMMUNITY): Payer: Self-pay | Admitting: Orthopedic Surgery

## 2021-02-02 ENCOUNTER — Ambulatory Visit (HOSPITAL_COMMUNITY): Payer: Medicare Other | Admitting: Physician Assistant

## 2021-02-02 ENCOUNTER — Encounter (HOSPITAL_COMMUNITY)
Admission: RE | Disposition: A | Discharge status: Home / Self Care | Payer: Self-pay | Source: Ambulatory Visit | Attending: Orthopedic Surgery

## 2021-02-02 ENCOUNTER — Ambulatory Visit (HOSPITAL_COMMUNITY): Payer: Medicare Other | Admitting: Anesthesiology

## 2021-02-02 ENCOUNTER — Observation Stay (HOSPITAL_COMMUNITY)
Admission: RE | Admit: 2021-02-02 | Discharge: 2021-02-04 | Disposition: A | Discharge status: Home / Self Care | Payer: Medicare Other | Source: Ambulatory Visit | Attending: Orthopedic Surgery | Admitting: Orthopedic Surgery

## 2021-02-02 DIAGNOSIS — Z951 Presence of aortocoronary bypass graft: Secondary | ICD-10-CM | POA: Insufficient documentation

## 2021-02-02 DIAGNOSIS — E119 Type 2 diabetes mellitus without complications: Secondary | ICD-10-CM | POA: Insufficient documentation

## 2021-02-02 DIAGNOSIS — Z7982 Long term (current) use of aspirin: Secondary | ICD-10-CM | POA: Insufficient documentation

## 2021-02-02 DIAGNOSIS — Z794 Long term (current) use of insulin: Secondary | ICD-10-CM | POA: Insufficient documentation

## 2021-02-02 DIAGNOSIS — I10 Essential (primary) hypertension: Secondary | ICD-10-CM | POA: Insufficient documentation

## 2021-02-02 DIAGNOSIS — Z96651 Presence of right artificial knee joint: Secondary | ICD-10-CM

## 2021-02-02 DIAGNOSIS — I2510 Atherosclerotic heart disease of native coronary artery without angina pectoris: Secondary | ICD-10-CM | POA: Insufficient documentation

## 2021-02-02 DIAGNOSIS — M1711 Unilateral primary osteoarthritis, right knee: Secondary | ICD-10-CM | POA: Diagnosis present

## 2021-02-02 DIAGNOSIS — Z79899 Other long term (current) drug therapy: Secondary | ICD-10-CM | POA: Insufficient documentation

## 2021-02-02 DIAGNOSIS — Z7984 Long term (current) use of oral hypoglycemic drugs: Secondary | ICD-10-CM | POA: Insufficient documentation

## 2021-02-02 HISTORY — PX: TOTAL KNEE ARTHROPLASTY: SHX125

## 2021-02-02 LAB — GLUCOSE, CAPILLARY
Glucose-Capillary: 158 mg/dL — ABNORMAL HIGH (ref 70–99)
Glucose-Capillary: 138 mg/dL — ABNORMAL HIGH (ref 70–99)
Glucose-Capillary: 187 mg/dL — ABNORMAL HIGH (ref 70–99)
Glucose-Capillary: 311 mg/dL — ABNORMAL HIGH (ref 70–99)

## 2021-02-02 LAB — HEMOGLOBIN A1C
Hgb A1c MFr Bld: 7.2 % — ABNORMAL HIGH (ref 4.8–5.6)
Mean Plasma Glucose: 159.94 mg/dL

## 2021-02-02 SURGERY — ARTHROPLASTY, KNEE, TOTAL
Anesthesia: General | Site: Knee | Laterality: Right

## 2021-02-02 MED ORDER — METHOCARBAMOL 500 MG PO TABS: 500.0000 mg | ORAL_TABLET | Freq: Four times a day (QID) | ORAL | Status: DC | PRN

## 2021-02-02 MED ORDER — ESCITALOPRAM OXALATE 10 MG PO TABS
10.0000 mg | ORAL_TABLET | Freq: Every day | ORAL | Status: DC
  Administered 2021-02-03 – 2021-02-04 (×2): 10 mg via ORAL
  Filled 2021-02-02 (×2): qty 1

## 2021-02-02 MED ORDER — SODIUM CHLORIDE (PF) 0.9 % IJ SOLN
INTRAMUSCULAR | Status: DC | PRN
  Administered 2021-02-02: 30 mL

## 2021-02-02 MED ORDER — BUPIVACAINE-EPINEPHRINE 0.25% -1:200000 IJ SOLN
INTRAMUSCULAR | Status: DC | PRN
  Administered 2021-02-02: 30 mL

## 2021-02-02 MED ORDER — FERROUS SULFATE 325 (65 FE) MG PO TABS
325.0000 mg | ORAL_TABLET | Freq: Three times a day (TID) | ORAL | Status: DC
  Administered 2021-02-03 – 2021-02-04 (×4): 325 mg via ORAL
  Filled 2021-02-02 (×4): qty 1

## 2021-02-02 MED ORDER — BUPIVACAINE LIPOSOME 1.3 % IJ SUSP
INTRAMUSCULAR | Status: DC | PRN
  Administered 2021-02-02: 20 mL

## 2021-02-02 MED ORDER — FENTANYL CITRATE (PF) 100 MCG/2ML IJ SOLN
INTRAMUSCULAR | Status: AC
  Filled 2021-02-02: qty 2

## 2021-02-02 MED ORDER — EPHEDRINE 5 MG/ML INJ
INTRAVENOUS | Status: AC
  Filled 2021-02-02: qty 5

## 2021-02-02 MED ORDER — MIDAZOLAM HCL 2 MG/2ML IJ SOLN
1.0000 mg | INTRAMUSCULAR | Status: DC
  Filled 2021-02-02: qty 2

## 2021-02-02 MED ORDER — TRANEXAMIC ACID-NACL 1000-0.7 MG/100ML-% IV SOLN
1000.0000 mg | INTRAVENOUS | Status: AC
  Administered 2021-02-02: 1000 mg via INTRAVENOUS
  Filled 2021-02-02: qty 100

## 2021-02-02 MED ORDER — DULOXETINE HCL 60 MG PO CPEP
60.0000 mg | ORAL_CAPSULE | Freq: Every day | ORAL | Status: DC
  Administered 2021-02-02 – 2021-02-03 (×2): 60 mg via ORAL
  Filled 2021-02-02 (×2): qty 1

## 2021-02-02 MED ORDER — PANTOPRAZOLE SODIUM 40 MG PO TBEC
40.0000 mg | DELAYED_RELEASE_TABLET | Freq: Every day | ORAL | Status: DC
  Administered 2021-02-03 – 2021-02-04 (×2): 40 mg via ORAL
  Filled 2021-02-02 (×2): qty 1

## 2021-02-02 MED ORDER — SODIUM CHLORIDE 0.9 % IV SOLN: INTRAVENOUS | Status: DC

## 2021-02-02 MED ORDER — POLYETHYLENE GLYCOL 3350 17 G PO PACK: 17.0000 g | PACK | Freq: Every day | ORAL | Status: DC | PRN

## 2021-02-02 MED ORDER — CHLORHEXIDINE GLUCONATE 0.12 % MT SOLN
15.0000 mL | Freq: Once | OROMUCOSAL | Status: AC
  Administered 2021-02-02: 15 mL via OROMUCOSAL

## 2021-02-02 MED ORDER — PROPOFOL 10 MG/ML IV BOLUS
INTRAVENOUS | Status: DC | PRN
  Administered 2021-02-02: 160 mg via INTRAVENOUS

## 2021-02-02 MED ORDER — ASPIRIN 81 MG PO CHEW
81.0000 mg | CHEWABLE_TABLET | Freq: Two times a day (BID) | ORAL | Status: DC
  Administered 2021-02-02 – 2021-02-04 (×4): 81 mg via ORAL
  Filled 2021-02-02 (×4): qty 1

## 2021-02-02 MED ORDER — PRAVASTATIN SODIUM 40 MG PO TABS
80.0000 mg | ORAL_TABLET | Freq: Every day | ORAL | Status: DC
  Administered 2021-02-02 – 2021-02-03 (×2): 80 mg via ORAL
  Filled 2021-02-02: qty 4
  Filled 2021-02-02: qty 2
  Filled 2021-02-02: qty 4
  Filled 2021-02-02: qty 2

## 2021-02-02 MED ORDER — ASPIRIN EC 81 MG PO TBEC: 81.0000 mg | DELAYED_RELEASE_TABLET | Freq: Two times a day (BID) | ORAL | 0 refills | Status: AC

## 2021-02-02 MED ORDER — HYDROMORPHONE HCL 2 MG/ML IJ SOLN
INTRAMUSCULAR | Status: AC
  Filled 2021-02-02: qty 1

## 2021-02-02 MED ORDER — FERROUS SULFATE 325 (65 FE) MG PO TABS: 325.0000 mg | ORAL_TABLET | Freq: Every day | ORAL | Status: DC

## 2021-02-02 MED ORDER — PHENOL 1.4 % MT LIQD
1.0000 | OROMUCOSAL | Status: DC | PRN
  Filled 2021-02-02: qty 177

## 2021-02-02 MED ORDER — OXCARBAZEPINE 150 MG PO TABS
150.0000 mg | ORAL_TABLET | Freq: Three times a day (TID) | ORAL | Status: DC
  Administered 2021-02-02 – 2021-02-04 (×6): 150 mg via ORAL
  Filled 2021-02-02 (×8): qty 1

## 2021-02-02 MED ORDER — DOCUSATE SODIUM 100 MG PO CAPS
100.0000 mg | ORAL_CAPSULE | Freq: Two times a day (BID) | ORAL | Status: DC
  Administered 2021-02-02 – 2021-02-04 (×4): 100 mg via ORAL
  Filled 2021-02-02 (×5): qty 1

## 2021-02-02 MED ORDER — CEFAZOLIN SODIUM-DEXTROSE 2-4 GM/100ML-% IV SOLN
2.0000 g | Freq: Four times a day (QID) | INTRAVENOUS | Status: AC
Start: 2021-02-02 — End: 2021-02-03
  Administered 2021-02-02 (×2): 2 g via INTRAVENOUS
  Filled 2021-02-02 (×2): qty 100

## 2021-02-02 MED ORDER — FENTANYL CITRATE (PF) 100 MCG/2ML IJ SOLN
50.0000 ug | INTRAMUSCULAR | Status: AC
  Administered 2021-02-02: 75 ug via INTRAVENOUS
  Filled 2021-02-02: qty 2

## 2021-02-02 MED ORDER — PROPOFOL 1000 MG/100ML IV EMUL
INTRAVENOUS | Status: AC
  Filled 2021-02-02: qty 100

## 2021-02-02 MED ORDER — CLONIDINE HCL 0.1 MG PO TABS
0.1000 mg | ORAL_TABLET | Freq: Every day | ORAL | Status: DC
  Administered 2021-02-03 – 2021-02-04 (×2): 0.1 mg via ORAL
  Filled 2021-02-02 (×3): qty 1

## 2021-02-02 MED ORDER — DULOXETINE HCL 30 MG PO CPEP
30.0000 mg | ORAL_CAPSULE | Freq: Every day | ORAL | Status: DC
  Administered 2021-02-03 – 2021-02-04 (×2): 30 mg via ORAL
  Filled 2021-02-02 (×2): qty 1

## 2021-02-02 MED ORDER — LACTATED RINGERS IV SOLN: INTRAVENOUS | Status: DC

## 2021-02-02 MED ORDER — MENTHOL 3 MG MT LOZG
1.0000 | LOZENGE | OROMUCOSAL | Status: DC | PRN
  Filled 2021-02-02: qty 9

## 2021-02-02 MED ORDER — MORPHINE SULFATE (PF) 4 MG/ML IV SOLN: 0.5000 mg | INTRAVENOUS | Status: DC | PRN

## 2021-02-02 MED ORDER — EMPAGLIFLOZIN 10 MG PO TABS
10.0000 mg | ORAL_TABLET | Freq: Every day | ORAL | Status: DC
  Administered 2021-02-02 – 2021-02-04 (×3): 10 mg via ORAL
  Filled 2021-02-02 (×3): qty 1

## 2021-02-02 MED ORDER — LIDOCAINE 2% (20 MG/ML) 5 ML SYRINGE
INTRAMUSCULAR | Status: AC
  Filled 2021-02-02: qty 5

## 2021-02-02 MED ORDER — BISACODYL 10 MG RE SUPP: 10.0000 mg | Freq: Every day | RECTAL | Status: DC | PRN

## 2021-02-02 MED ORDER — INSULIN GLARGINE-YFGN 100 UNIT/ML ~~LOC~~ SOLN
50.0000 [IU] | Freq: Every day | SUBCUTANEOUS | Status: DC
  Administered 2021-02-02 – 2021-02-03 (×2): 50 [IU] via SUBCUTANEOUS
  Filled 2021-02-02 (×2): qty 0.5

## 2021-02-02 MED ORDER — CEFAZOLIN SODIUM-DEXTROSE 2-4 GM/100ML-% IV SOLN
2.0000 g | INTRAVENOUS | Status: AC
  Administered 2021-02-02: 2 g via INTRAVENOUS
  Filled 2021-02-02: qty 100

## 2021-02-02 MED ORDER — VITAMIN D 25 MCG (1000 UNIT) PO TABS
1000.0000 [IU] | ORAL_TABLET | Freq: Every day | ORAL | Status: DC
  Administered 2021-02-03 – 2021-02-04 (×2): 1000 [IU] via ORAL
  Filled 2021-02-02 (×2): qty 1

## 2021-02-02 MED ORDER — RANOLAZINE ER 500 MG PO TB12
500.0000 mg | ORAL_TABLET | Freq: Two times a day (BID) | ORAL | Status: DC
  Administered 2021-02-02 – 2021-02-04 (×4): 500 mg via ORAL
  Filled 2021-02-02 (×4): qty 1

## 2021-02-02 MED ORDER — LIDOCAINE 2% (20 MG/ML) 5 ML SYRINGE
INTRAMUSCULAR | Status: DC | PRN
  Administered 2021-02-02: 80 mg via INTRAVENOUS

## 2021-02-02 MED ORDER — 0.9 % SODIUM CHLORIDE (POUR BTL) OPTIME
TOPICAL | Status: DC | PRN
  Administered 2021-02-02: 1000 mL

## 2021-02-02 MED ORDER — HYDROMORPHONE HCL 1 MG/ML IJ SOLN
INTRAMUSCULAR | Status: DC | PRN
  Administered 2021-02-02 (×4): .5 mg via INTRAVENOUS

## 2021-02-02 MED ORDER — MIDAZOLAM HCL 2 MG/2ML IJ SOLN
INTRAMUSCULAR | Status: AC
  Filled 2021-02-02: qty 2

## 2021-02-02 MED ORDER — DARIFENACIN HYDROBROMIDE ER 7.5 MG PO TB24
7.5000 mg | ORAL_TABLET | Freq: Every day | ORAL | Status: DC
  Administered 2021-02-03 – 2021-02-04 (×2): 7.5 mg via ORAL
  Filled 2021-02-02 (×3): qty 1

## 2021-02-02 MED ORDER — SODIUM CHLORIDE (PF) 0.9 % IJ SOLN
INTRAMUSCULAR | Status: AC
  Filled 2021-02-02: qty 30

## 2021-02-02 MED ORDER — PROPOFOL 10 MG/ML IV BOLUS
INTRAVENOUS | Status: AC
  Filled 2021-02-02: qty 20

## 2021-02-02 MED ORDER — ACETAMINOPHEN 500 MG PO TABS
1000.0000 mg | ORAL_TABLET | Freq: Once | ORAL | Status: AC
  Administered 2021-02-02: 1000 mg via ORAL
  Filled 2021-02-02: qty 2

## 2021-02-02 MED ORDER — ONDANSETRON HCL 4 MG/2ML IJ SOLN
INTRAMUSCULAR | Status: DC | PRN
  Administered 2021-02-02: 4 mg via INTRAVENOUS

## 2021-02-02 MED ORDER — INSULIN ASPART 100 UNIT/ML IJ SOLN
0.0000 [IU] | Freq: Three times a day (TID) | INTRAMUSCULAR | Status: DC
  Administered 2021-02-02: 4 [IU] via SUBCUTANEOUS
  Administered 2021-02-03: 7 [IU] via SUBCUTANEOUS
  Administered 2021-02-03 (×2): 4 [IU] via SUBCUTANEOUS

## 2021-02-02 MED ORDER — MONTELUKAST SODIUM 10 MG PO TABS
10.0000 mg | ORAL_TABLET | Freq: Every day | ORAL | Status: DC
  Administered 2021-02-02 – 2021-02-03 (×2): 10 mg via ORAL
  Filled 2021-02-02 (×2): qty 1

## 2021-02-02 MED ORDER — SODIUM CHLORIDE 0.9 % IR SOLN
Status: DC | PRN
  Administered 2021-02-02: 1000 mL

## 2021-02-02 MED ORDER — WATER FOR IRRIGATION, STERILE IR SOLN
Status: DC | PRN
  Administered 2021-02-02: 2000 mL

## 2021-02-02 MED ORDER — GLIPIZIDE 10 MG PO TABS
10.0000 mg | ORAL_TABLET | Freq: Two times a day (BID) | ORAL | Status: DC
  Administered 2021-02-03 – 2021-02-04 (×3): 10 mg via ORAL
  Filled 2021-02-02 (×3): qty 1

## 2021-02-02 MED ORDER — METFORMIN HCL 500 MG PO TABS
1000.0000 mg | ORAL_TABLET | Freq: Two times a day (BID) | ORAL | Status: DC
  Administered 2021-02-03 – 2021-02-04 (×3): 1000 mg via ORAL
  Filled 2021-02-02 (×3): qty 2

## 2021-02-02 MED ORDER — ONDANSETRON HCL 4 MG/2ML IJ SOLN: 4.0000 mg | Freq: Four times a day (QID) | INTRAMUSCULAR | Status: DC | PRN

## 2021-02-02 MED ORDER — TAMSULOSIN HCL 0.4 MG PO CAPS
0.4000 mg | ORAL_CAPSULE | Freq: Every day | ORAL | Status: DC
  Administered 2021-02-03 – 2021-02-04 (×2): 0.4 mg via ORAL
  Filled 2021-02-02 (×2): qty 1

## 2021-02-02 MED ORDER — METHOCARBAMOL 500 MG PO TABS: 500.0000 mg | ORAL_TABLET | Freq: Four times a day (QID) | ORAL | 1 refills | Status: AC | PRN

## 2021-02-02 MED ORDER — BUPIVACAINE-EPINEPHRINE (PF) 0.25% -1:200000 IJ SOLN
INTRAMUSCULAR | Status: AC
  Filled 2021-02-02: qty 30

## 2021-02-02 MED ORDER — INSULIN ASPART 100 UNIT/ML IJ SOLN
6.0000 [IU] | Freq: Three times a day (TID) | INTRAMUSCULAR | Status: DC
  Administered 2021-02-02 – 2021-02-04 (×5): 6 [IU] via SUBCUTANEOUS

## 2021-02-02 MED ORDER — EPHEDRINE SULFATE-NACL 50-0.9 MG/10ML-% IV SOSY
PREFILLED_SYRINGE | INTRAVENOUS | Status: DC | PRN
  Administered 2021-02-02 (×2): 5 mg via INTRAVENOUS

## 2021-02-02 MED ORDER — METHOCARBAMOL 500 MG IVPB - SIMPLE MED
500.0000 mg | Freq: Four times a day (QID) | INTRAVENOUS | Status: DC | PRN
  Filled 2021-02-02: qty 50

## 2021-02-02 MED ORDER — LOSARTAN POTASSIUM 50 MG PO TABS
100.0000 mg | ORAL_TABLET | Freq: Every day | ORAL | Status: DC
  Administered 2021-02-03 – 2021-02-04 (×2): 100 mg via ORAL
  Filled 2021-02-02 (×3): qty 2

## 2021-02-02 MED ORDER — ORAL CARE MOUTH RINSE: 15.0000 mL | Freq: Once | OROMUCOSAL | Status: AC

## 2021-02-02 MED ORDER — ADULT MULTIVITAMIN W/MINERALS CH
1.0000 | ORAL_TABLET | Freq: Every day | ORAL | Status: DC
  Administered 2021-02-03 – 2021-02-04 (×2): 1 via ORAL
  Filled 2021-02-02 (×2): qty 1

## 2021-02-02 MED ORDER — TESTOSTERONE 50 MG/5GM (1%) TD GEL
2.5000 g | Freq: Every day | TRANSDERMAL | Status: DC
  Filled 2021-02-02 (×2): qty 5

## 2021-02-02 MED ORDER — HYDROCODONE-ACETAMINOPHEN 5-325 MG PO TABS: 1.0000 | ORAL_TABLET | Freq: Four times a day (QID) | ORAL | 0 refills | Status: AC | PRN

## 2021-02-02 MED ORDER — ACETAMINOPHEN 325 MG PO TABS
325.0000 mg | ORAL_TABLET | Freq: Four times a day (QID) | ORAL | Status: DC | PRN
Start: 2021-02-03 — End: 2021-02-04

## 2021-02-02 MED ORDER — ONDANSETRON HCL 4 MG PO TABS
4.0000 mg | ORAL_TABLET | Freq: Four times a day (QID) | ORAL | Status: DC | PRN
  Filled 2021-02-02: qty 1

## 2021-02-02 MED ORDER — POVIDONE-IODINE 10 % EX SWAB
2.0000 "application " | Freq: Once | CUTANEOUS | Status: AC
  Administered 2021-02-02: 2 via TOPICAL

## 2021-02-02 MED ORDER — CLONIDINE HCL (ANALGESIA) 100 MCG/ML EP SOLN
EPIDURAL | Status: DC | PRN
  Administered 2021-02-02: 50 ug

## 2021-02-02 MED ORDER — METOCLOPRAMIDE HCL 5 MG PO TABS
5.0000 mg | ORAL_TABLET | Freq: Three times a day (TID) | ORAL | Status: DC | PRN
  Filled 2021-02-02: qty 2

## 2021-02-02 MED ORDER — AMLODIPINE BESYLATE 10 MG PO TABS
10.0000 mg | ORAL_TABLET | Freq: Every day | ORAL | Status: DC
  Administered 2021-02-03 – 2021-02-04 (×2): 10 mg via ORAL
  Filled 2021-02-02 (×2): qty 1

## 2021-02-02 MED ORDER — DEXAMETHASONE SODIUM PHOSPHATE 10 MG/ML IJ SOLN
INTRAMUSCULAR | Status: DC | PRN
  Administered 2021-02-02: 10 mg via INTRAVENOUS

## 2021-02-02 MED ORDER — HYDROCODONE-ACETAMINOPHEN 7.5-325 MG PO TABS
1.0000 | ORAL_TABLET | ORAL | Status: DC | PRN
  Administered 2021-02-02: 1 via ORAL
  Administered 2021-02-03 (×2): 2 via ORAL
  Administered 2021-02-03 (×2): 1 via ORAL
  Administered 2021-02-04 (×2): 2 via ORAL
  Filled 2021-02-02: qty 1
  Filled 2021-02-02 (×2): qty 2
  Filled 2021-02-02 (×2): qty 1
  Filled 2021-02-02 (×2): qty 2

## 2021-02-02 MED ORDER — METHOCARBAMOL 500 MG PO TABS
500.0000 mg | ORAL_TABLET | Freq: Three times a day (TID) | ORAL | Status: DC
  Administered 2021-02-02 – 2021-02-04 (×5): 500 mg via ORAL
  Filled 2021-02-02 (×6): qty 1

## 2021-02-02 MED ORDER — ROPIVACAINE HCL 5 MG/ML IJ SOLN
INTRAMUSCULAR | Status: DC | PRN
  Administered 2021-02-02: 30 mL via EPIDURAL

## 2021-02-02 MED ORDER — PROPRANOLOL HCL 10 MG PO TABS
10.0000 mg | ORAL_TABLET | Freq: Two times a day (BID) | ORAL | Status: DC
  Administered 2021-02-02 – 2021-02-04 (×4): 10 mg via ORAL
  Filled 2021-02-02 (×4): qty 1

## 2021-02-02 MED ORDER — METOCLOPRAMIDE HCL 5 MG/ML IJ SOLN: 5.0000 mg | Freq: Three times a day (TID) | INTRAMUSCULAR | Status: DC | PRN

## 2021-02-02 MED ORDER — FENTANYL CITRATE (PF) 100 MCG/2ML IJ SOLN
INTRAMUSCULAR | Status: DC | PRN
  Administered 2021-02-02: 50 ug via INTRAVENOUS
  Administered 2021-02-02 (×2): 25 ug via INTRAVENOUS

## 2021-02-02 MED ORDER — MEMANTINE HCL 10 MG PO TABS
20.0000 mg | ORAL_TABLET | Freq: Every day | ORAL | Status: DC
  Administered 2021-02-02 – 2021-02-03 (×2): 20 mg via ORAL
  Filled 2021-02-02 (×2): qty 2

## 2021-02-02 MED ORDER — TRANEXAMIC ACID-NACL 1000-0.7 MG/100ML-% IV SOLN
1000.0000 mg | Freq: Once | INTRAVENOUS | Status: AC
  Administered 2021-02-02: 1000 mg via INTRAVENOUS
  Filled 2021-02-02: qty 100

## 2021-02-02 MED ORDER — ONDANSETRON HCL 4 MG/2ML IJ SOLN
INTRAMUSCULAR | Status: AC
  Filled 2021-02-02: qty 2

## 2021-02-02 MED ORDER — DEXAMETHASONE SODIUM PHOSPHATE 10 MG/ML IJ SOLN
INTRAMUSCULAR | Status: AC
  Filled 2021-02-02: qty 1

## 2021-02-02 MED ORDER — MECLIZINE HCL 25 MG PO TABS: 25.0000 mg | ORAL_TABLET | Freq: Two times a day (BID) | ORAL | Status: DC | PRN

## 2021-02-02 MED ORDER — INSULIN ASPART 100 UNIT/ML IJ SOLN
0.0000 [IU] | Freq: Every day | INTRAMUSCULAR | Status: DC
  Administered 2021-02-02: 4 [IU] via SUBCUTANEOUS

## 2021-02-02 SURGICAL SUPPLY — 58 items
ATTUNE MED DOME PAT 38 KNEE (Knees) ×2 IMPLANT
ATTUNE MED DOME PAT 38MM KNEE (Knees) ×1 IMPLANT
ATTUNE PS FEM RT SZ 7 CEM KNEE (Femur) ×3 IMPLANT
ATTUNE PSRP INSR SZ7 10 KNEE (Insert) ×2 IMPLANT
ATTUNE PSRP INSR SZ7 10MM KNEE (Insert) ×1 IMPLANT
BAG COUNTER SPONGE SURGICOUNT (BAG) IMPLANT
BAG SURGICOUNT SPONGE COUNTING (BAG)
BAG ZIPLOCK 12X15 (MISCELLANEOUS) IMPLANT
BASE TIBIAL ROT PLAT SZ 7 KNEE (Knees) ×1 IMPLANT
BIT DRILL 1.6MX128 (BIT) ×2 IMPLANT
BIT DRILL 1.6MX128MM (BIT) ×1
BLADE SAG 18X100X1.27 (BLADE) ×3 IMPLANT
BLADE SAW SGTL 13X75X1.27 (BLADE) ×3 IMPLANT
BNDG ELASTIC 6X10 VLCR STRL LF (GAUZE/BANDAGES/DRESSINGS) ×3 IMPLANT
BNDG ELASTIC 6X5.8 VLCR STR LF (GAUZE/BANDAGES/DRESSINGS) ×3 IMPLANT
BNDG GAUZE ELAST 4 BULKY (GAUZE/BANDAGES/DRESSINGS) ×3 IMPLANT
BOWL SMART MIX CTS (DISPOSABLE) ×3 IMPLANT
CEMENT HV SMART SET (Cement) ×6 IMPLANT
CLOSURE WOUND 1/2 X4 (GAUZE/BANDAGES/DRESSINGS) ×2
COVER SURGICAL LIGHT HANDLE (MISCELLANEOUS) ×3 IMPLANT
CUFF TOURN SGL QUICK 34 (TOURNIQUET CUFF) ×3
CUFF TRNQT CYL 34X4.125X (TOURNIQUET CUFF) ×1 IMPLANT
DRAPE SHEET LG 3/4 BI-LAMINATE (DRAPES) ×3 IMPLANT
DRAPE U-SHAPE 47X51 STRL (DRAPES) ×3 IMPLANT
DRSG ADAPTIC 3X8 NADH LF (GAUZE/BANDAGES/DRESSINGS) ×3 IMPLANT
DRSG PAD ABDOMINAL 8X10 ST (GAUZE/BANDAGES/DRESSINGS) ×3 IMPLANT
DURAPREP 26ML APPLICATOR (WOUND CARE) ×3 IMPLANT
ELECT REM PT RETURN 15FT ADLT (MISCELLANEOUS) ×3 IMPLANT
GAUZE SPONGE 4X4 12PLY STRL (GAUZE/BANDAGES/DRESSINGS) ×3 IMPLANT
GLOVE SURG ORTHO LTX SZ7.5 (GLOVE) ×3 IMPLANT
GLOVE SURG ORTHO LTX SZ8.5 (GLOVE) ×3 IMPLANT
GLOVE SURG UNDER POLY LF SZ7.5 (GLOVE) ×3 IMPLANT
GLOVE SURG UNDER POLY LF SZ8.5 (GLOVE) ×3 IMPLANT
GOWN STRL REUS W/TWL XL LVL3 (GOWN DISPOSABLE) ×6 IMPLANT
HANDPIECE INTERPULSE COAX TIP (DISPOSABLE) ×3
HOLDER FOLEY CATH W/STRAP (MISCELLANEOUS) IMPLANT
IMMOBILIZER KNEE 20 (SOFTGOODS)
IMMOBILIZER KNEE 20 THIGH 36 (SOFTGOODS) IMPLANT
IMMOBILIZER KNEE 22 UNIV (SOFTGOODS) ×3 IMPLANT
KIT TURNOVER KIT A (KITS) ×3 IMPLANT
MANIFOLD NEPTUNE II (INSTRUMENTS) ×3 IMPLANT
NS IRRIG 1000ML POUR BTL (IV SOLUTION) ×3 IMPLANT
PACK TOTAL KNEE CUSTOM (KITS) ×3 IMPLANT
PENCIL SMOKE EVACUATOR (MISCELLANEOUS) IMPLANT
PIN DRILL FIX HALF THREAD (BIT) ×3 IMPLANT
PIN STEINMAN FIXATION KNEE (PIN) ×3 IMPLANT
PROTECTOR NERVE ULNAR (MISCELLANEOUS) ×3 IMPLANT
SET HNDPC FAN SPRY TIP SCT (DISPOSABLE) ×1 IMPLANT
STAPLER VISISTAT 35W (STAPLE) IMPLANT
STRIP CLOSURE SKIN 1/2X4 (GAUZE/BANDAGES/DRESSINGS) ×4 IMPLANT
SUT MNCRL AB 3-0 PS2 18 (SUTURE) ×3 IMPLANT
SUT VIC AB 0 CT1 36 (SUTURE) ×3 IMPLANT
SUT VIC AB 1 CT1 36 (SUTURE) ×9 IMPLANT
SUT VIC AB 2-0 CT1 27 (SUTURE) ×3
SUT VIC AB 2-0 CT1 TAPERPNT 27 (SUTURE) ×1 IMPLANT
TIBIAL BASE ROT PLAT SZ 7 KNEE (Knees) ×3 IMPLANT
TRAY FOLEY MTR SLVR 16FR STAT (SET/KITS/TRAYS/PACK) ×3 IMPLANT
WATER STERILE IRR 1000ML POUR (IV SOLUTION) ×6 IMPLANT

## 2021-02-02 NOTE — Discharge Instructions (Addendum)
Ice to the knee constantly.  Keep the incision covered and clean and dry for one week, then ok to get it wet in the shower.  Do exercise as instructed every hour, please to prevent stiffness.    DO NOT prop anything under the knee, it will make your knee stiff.  Prop under the ankle to encourage your knee to go straight.   Use the walker while you are up and around for balance.  Wear your support stockings 24/7 to prevent blood clots and take baby aspirin twice daily for 30 days also to prevent blood clots  Follow up with Dr Younes Degeorge in two weeks in the office, call [phone] for appt   INSTRUCTIONS AFTER JOINT REPLACEMENT   Remove items at home which could result in a fall. This includes throw rugs or furniture in walking pathways ICE to the affected joint every three hours while awake for 30 minutes at a time, for at least the first 3-5 days, and then as needed for pain and swelling.  Continue to use ice for pain and swelling. You may notice swelling that will progress down to the foot and ankle.  This is normal after surgery.  Elevate your leg when you are not up walking on it.   Continue to use the breathing machine you got in the hospital (incentive spirometer) which will help keep your temperature down.  It is common for your temperature to cycle up and down following surgery, especially at night when you are not up moving around and exerting yourself.  The breathing machine keeps your lungs expanded and your temperature down.   DIET:  As you were doing prior to hospitalization, we recommend a well-balanced diet.  DRESSING / WOUND CARE / SHOWERING  You may change your dressing 3-5 days after surgery.  Then change the dressing every day with sterile gauze.  Please use good hand washing techniques before changing the dressing.  Do not use any lotions or creams on the incision until instructed by your surgeon.  ACTIVITY  Increase activity slowly as tolerated, but follow the weight  bearing instructions below.   No driving for 6 weeks or until further direction given by your physician.  You cannot drive while taking narcotics.  No lifting or carrying greater than 10 lbs. until further directed by your surgeon. Avoid periods of inactivity such as sitting longer than an hour when not asleep. This helps prevent blood clots.  You may return to work once you are authorized by your doctor.     WEIGHT BEARING   Weight bearing as tolerated with assist device (walker, cane, etc) as directed, use it as long as suggested by your surgeon or therapist, typically at least 4-6 weeks.   EXERCISES  Results after joint replacement surgery are often greatly improved when you follow the exercise, range of motion and muscle strengthening exercises prescribed by your doctor. Safety measures are also important to protect the joint from further injury. Any time any of these exercises cause you to have increased pain or swelling, decrease what you are doing until you are comfortable again and then slowly increase them. If you have problems or questions, call your caregiver or physical therapist for advice.   Rehabilitation is important following a joint replacement. After just a few days of immobilization, the muscles of the leg can become weakened and shrink (atrophy).  These exercises are designed to build up the tone and strength of the thigh and leg muscles and to   improve motion. Often times heat used for twenty to thirty minutes before working out will loosen up your tissues and help with improving the range of motion but do not use heat for the first two weeks following surgery (sometimes heat can increase post-operative swelling).   These exercises can be done on a training (exercise) mat, on the floor, on a table or on a bed. Use whatever works the best and is most comfortable for you.    Use music or television while you are exercising so that the exercises are a pleasant break in your day.  This will make your life better with the exercises acting as a break in your routine that you can look forward to.   Perform all exercises about fifteen times, three times per day or as directed.  You should exercise both the operative leg and the other leg as well.  Exercises include:   Quad Sets - Tighten up the muscle on the front of the thigh (Quad) and hold for 5-10 seconds.   Straight Leg Raises - With your knee straight (if you were given a brace, keep it on), lift the leg to 60 degrees, hold for 3 seconds, and slowly lower the leg.  Perform this exercise against resistance later as your leg gets stronger.  Leg Slides: Lying on your back, slowly slide your foot toward your buttocks, bending your knee up off the floor (only go as far as is comfortable). Then slowly slide your foot back down until your leg is flat on the floor again.  Angel Wings: Lying on your back spread your legs to the side as far apart as you can without causing discomfort.  Hamstring Strength:  Lying on your back, push your heel against the floor with your leg straight by tightening up the muscles of your buttocks.  Repeat, but this time bend your knee to a comfortable angle, and push your heel against the floor.  You may put a pillow under the heel to make it more comfortable if necessary.   A rehabilitation program following joint replacement surgery can speed recovery and prevent re-injury in the future due to weakened muscles. Contact your doctor or a physical therapist for more information on knee rehabilitation.    CONSTIPATION  Constipation is defined medically as fewer than three stools per week and severe constipation as less than one stool per week.  Even if you have a regular bowel pattern at home, your normal regimen is likely to be disrupted due to multiple reasons following surgery.  Combination of anesthesia, postoperative narcotics, change in appetite and fluid intake all can affect your bowels.   YOU MUST  use at least one of the following options; they are listed in order of increasing strength to get the job done.  They are all available over the counter, and you may need to use some, POSSIBLY even all of these options:    Drink plenty of fluids (prune juice may be helpful) and high fiber foods Colace 100 mg by mouth twice a day  Senokot for constipation as directed and as needed Dulcolax (bisacodyl), take with full glass of water  Miralax (polyethylene glycol) once or twice a day as needed.  If you have tried all these things and are unable to have a bowel movement in the first 3-4 days after surgery call either your surgeon or your primary doctor.    If you experience loose stools or diarrhea, hold the medications until you stool forms back   up.  If your symptoms do not get better within 1 week or if they get worse, check with your doctor.  If you experience "the worst abdominal pain ever" or develop nausea or vomiting, please contact the office immediately for further recommendations for treatment.   ITCHING:  If you experience itching with your medications, try taking only a single pain pill, or even half a pain pill at a time.  You can also use Benadryl over the counter for itching or also to help with sleep.   TED HOSE STOCKINGS:  Use stockings on both legs until for at least 2 weeks or as directed by physician office. They may be removed at night for sleeping.  MEDICATIONS:  See your medication summary on the "After Visit Summary" that nursing will review with you.  You may have some home medications which will be placed on hold until you complete the course of blood thinner medication.  It is important for you to complete the blood thinner medication as prescribed.  PRECAUTIONS:  If you experience chest pain or shortness of breath - call 911 immediately for transfer to the hospital emergency department.   If you develop a fever greater that 101 F, purulent drainage from wound, increased  redness or drainage from wound, foul odor from the wound/dressing, or calf pain - CONTACT YOUR SURGEON.                                                   FOLLOW-UP APPOINTMENTS:  If you do not already have a post-op appointment, please call the office for an appointment to be seen by your surgeon.  Guidelines for how soon to be seen are listed in your "After Visit Summary", but are typically between 1-4 weeks after surgery.  OTHER INSTRUCTIONS:   Knee Replacement:  Do not place pillow under knee, focus on keeping the knee straight while resting. CPM instructions: 0-90 degrees, 2 hours in the morning, 2 hours in the afternoon, and 2 hours in the evening. Place foam block, curve side up under heel at all times except when in CPM or when walking.  DO NOT modify, tear, cut, or change the foam block in any way.  POST-OPERATIVE OPIOID TAPER INSTRUCTIONS: It is important to wean off of your opioid medication as soon as possible. If you do not need pain medication after your surgery it is ok to stop day one. Opioids include: Codeine, Hydrocodone(Norco, Vicodin), Oxycodone(Percocet, oxycontin) and hydromorphone amongst others.  Long term and even short term use of opiods can cause: Increased pain response Dependence Constipation Depression Respiratory depression And more.  Withdrawal symptoms can include Flu like symptoms Nausea, vomiting And more Techniques to manage these symptoms Hydrate well Eat regular healthy meals Stay active Use relaxation techniques(deep breathing, meditating, yoga) Do Not substitute Alcohol to help with tapering If you have been on opioids for less than two weeks and do not have pain than it is ok to stop all together.  Plan to wean off of opioids This plan should start within one week post op of your joint replacement. Maintain the same interval or time between taking each dose and first decrease the dose.  Cut the total daily intake of opioids by one tablet each  day Next start to increase the time between doses. The last dose that should be eliminated is   the evening dose.   MAKE SURE YOU:  Understand these instructions.  Get help right away if you are not doing well or get worse.    Thank you for letting us be a part of your medical care team.  It is a privilege we respect greatly.  We hope these instructions will help you stay on track for a fast and full recovery!      

## 2021-02-02 NOTE — Transfer of Care (Signed)
Immediate Anesthesia Transfer of Care Note  Patient: [name]  Procedure(s) Performed: TOTAL KNEE ARTHROPLASTY (Right: Knee)  Patient Location: PACU  Anesthesia Type:General and Regional  Level of Consciousness: awake, alert  and oriented  Airway & Oxygen Therapy: Patient Spontanous Breathing and Patient connected to face mask oxygen  Post-op Assessment: Report given to RN and Post -op Vital signs reviewed and stable  Post vital signs: Reviewed and stable  Last Vitals:  Vitals Value Taken Time  BP 121/67 02/02/21 1209  Temp    Pulse 61 02/02/21 1212  Resp 7 02/02/21 1212  SpO2 95 % 02/02/21 1212  Vitals shown include unvalidated device data.  Last Pain:  Vitals:   02/02/21 0930  TempSrc:   PainSc: Asleep         Complications: No notable events documented.

## 2021-02-02 NOTE — Interval H&P Note (Signed)
History and Physical Interval Note:  02/02/2021 9:29 AM  [name]  has presented today for surgery, with the diagnosis of right knee end stage osteoarthritis.  The various methods of treatment have been discussed with the patient and family. After consideration of risks, benefits and other options for treatment, the patient has consented to  Procedure(s): TOTAL KNEE ARTHROPLASTY (Right) as a surgical intervention.  The patient's history has been reviewed, patient examined, no change in status, stable for surgery.  I have reviewed the patient's chart and labs.  Questions were answered to the patient's satisfaction.     Verlee Rossetti

## 2021-02-02 NOTE — Brief Op Note (Signed)
02/02/2021  12:03 PM  PATIENT:  [name]  75 y.o. male  PRE-OPERATIVE DIAGNOSIS:  right knee end stage osteoarthritis  POST-OPERATIVE DIAGNOSIS:  right knee end stage osteoarthritis  PROCEDURE:  Procedure(s): TOTAL KNEE ARTHROPLASTY (Right) DePuy Attune  SURGEON:  Surgeon(s) and Role:    Beverely Low, MD - Primary  PHYSICIAN ASSISTANT:   ASSISTANTS: Thea Gist, PA-C   ANESTHESIA:   regional and spinal  EBL:  50 mL   BLOOD ADMINISTERED:none  DRAINS: none   LOCAL MEDICATIONS USED:  MARCAINE     SPECIMEN:  No Specimen  DISPOSITION OF SPECIMEN:  N/A  COUNTS:  YES  TOURNIQUET:   Total Tourniquet Time Documented: Thigh (Right) - 95 minutes Total: Thigh (Right) - 95 minutes   DICTATION: .Other Dictation: Dictation Number [id number]  PLAN OF CARE: Admit for overnight observation  PATIENT DISPOSITION:  PACU - hemodynamically stable.   Delay start of Pharmacological VTE agent (>24hrs) due to surgical blood loss or risk of bleeding: no

## 2021-02-02 NOTE — Progress Notes (Signed)
Orthopedic Tech Progress Note Patient Details:  [name] Nov 21, 1945 [id number]  CPM Right Knee CPM Right Knee: On Right Knee Flexion (Degrees): 60 Right Knee Extension (Degrees): 0  Post Interventions Patient Tolerated: Well Instructions Provided: Care of device  Saul Fordyce 02/02/2021, 12:28 PM

## 2021-02-02 NOTE — Progress Notes (Addendum)
AssistedDr. Desmond Lope with right, ultrasound guided, adductor canal knee block. Side rails up, monitors on throughout procedure. See vital signs in flow sheet. Tolerated Procedure well.

## 2021-02-02 NOTE — Anesthesia Procedure Notes (Signed)
Procedure Name: LMA Insertion Date/Time: 02/02/2021 10:04 AM Performed by: Marirose Deveney D, CRNA Pre-anesthesia Checklist: Patient identified, Emergency Drugs available, Suction available and Patient being monitored Patient Re-evaluated:Patient Re-evaluated prior to induction Oxygen Delivery Method: Circle system utilized Preoxygenation: Pre-oxygenation with 100% oxygen Induction Type: IV induction Ventilation: Mask ventilation without difficulty LMA: LMA inserted LMA Size: 4.0 Tube type: Oral Number of attempts: 1 Placement Confirmation: positive ETCO2 and breath sounds checked- equal and bilateral Tube secured with: Tape Dental Injury: Teeth and Oropharynx as per pre-operative assessment

## 2021-02-02 NOTE — Anesthesia Procedure Notes (Signed)
Anesthesia Regional Block: Adductor canal block   Pre-Anesthetic Checklist: , timeout performed,  Correct Patient, Correct Site, Correct Laterality,  Correct Procedure, Correct Position, site marked,  Risks and benefits discussed,  Surgical consent,  Pre-op evaluation,  At surgeon's request and post-op pain management  Laterality: Right  Prep: chloraprep       Needles:  Injection technique: Single-shot  Needle Type: Echogenic Needle     Needle Length: 9cm  Needle Gauge: 21     Additional Needles:   Procedures:,,,, ultrasound used (permanent image in chart),,    Narrative:  Start time: 02/02/2021 8:39 AM End time: 02/02/2021 8:45 AM Injection made incrementally with aspirations every 5 mL.  Performed by: Personally  Anesthesiologist: Cecile Hearing, MD  Additional Notes: No pain on injection. No increased resistance to injection. Injection made in 5cc increments.  Good needle visualization.  Patient tolerated procedure well.

## 2021-02-02 NOTE — Anesthesia Postprocedure Evaluation (Signed)
Anesthesia Post Note  Patient: WREN GALLAGA  Procedure(s) Performed: TOTAL KNEE ARTHROPLASTY (Right: Knee)     Patient location during evaluation: PACU Anesthesia Type: General Level of consciousness: awake and alert, oriented and awake Pain management: pain level controlled Vital Signs Assessment: post-procedure vital signs reviewed and stable Respiratory status: spontaneous breathing, nonlabored ventilation and respiratory function stable Cardiovascular status: blood pressure returned to baseline and stable Postop Assessment: no apparent nausea or vomiting Anesthetic complications: no   No notable events documented.  Last Vitals:  Vitals:   02/02/21 1409 02/02/21 1700  BP: (!) 146/83 116/68  Pulse: 67 76  Resp: 12 12  Temp: 36.6 C 36.5 C  SpO2: 96% 94%    Last Pain:  Vitals:   02/02/21 1700  TempSrc: Oral  PainSc:                  Cecile Hearing

## 2021-02-02 NOTE — Evaluation (Signed)
Physical Therapy Evaluation Patient Details Name: [name] MRN: [id number] DOB: 1946/04/25 Today's Date: 02/02/2021   History of Present Illness  Pt s/p R TKR and with hx of L TKR, DM, CAD, MI, CABG, Back surgeries, and memory Deficits  Clinical Impression  Pt s/p R TKR and presents with decreased R LE strength/ROM, post op pain and premorbid deconditioning limiting functional mobility.  Pt should progress to dc home with family assist and reports first OPPT appt scheduled for 02/06/21.    Follow Up Recommendations Follow surgeon's recommendation for DC plan and follow-up therapies    Equipment Recommendations  None recommended by PT    Recommendations for Other Services       Precautions / Restrictions Precautions Precautions: Knee;Fall Required Braces or Orthoses: Knee Immobilizer - Right Knee Immobilizer - Right: Discontinue once straight leg raise with < 10 degree lag Restrictions Weight Bearing Restrictions: No Other Position/Activity Restrictions: WBAT      Mobility  Bed Mobility Overal bed mobility: Needs Assistance Bed Mobility: Supine to Sit     Supine to sit: Mod assist     General bed mobility comments: cues for sequence and use of L LE to self assist.  Physical assist to manage R LE and to control trunk    Transfers Overall transfer level: Needs assistance Equipment used: Rolling walker (2 wheeled) Transfers: Sit to/from Stand Sit to Stand: Mod assist         General transfer comment: cues for LE management and use of UEs to self assist  Ambulation/Gait Ambulation/Gait assistance: Min assist Gait Distance (Feet): 18 Feet Assistive device: Rolling walker (2 wheeled) Gait Pattern/deviations: Step-to pattern;Decreased step length - right;Decreased step length - left;Shuffle;Trunk flexed Gait velocity: decr   General Gait Details: cues for sequence, posture and position from [employer]            Wheelchair Mobility    Modified  Rankin (Stroke Patients Only)       Balance Overall balance assessment: Needs assistance Sitting-balance support: No upper extremity supported;Feet supported Sitting balance-Leahy Scale: Fair     Standing balance support: Bilateral upper extremity supported Standing balance-Leahy Scale: Poor                               Pertinent Vitals/Pain Pain Assessment: 0-10 Pain Score: 5  Pain Location: R knee Pain Descriptors / Indicators: Aching;Sore Pain Intervention(s): Limited activity within patient's tolerance;Monitored during session;Premedicated before session;Ice applied    Home Living Family/patient expects to be discharged to:: Private residence Living Arrangements: Spouse/significant other Available Help at Discharge: Family Type of Home: House Home Access: Stairs to enter Entrance Stairs-Rails: Right Entrance Stairs-Number of Steps: 2 Home Layout: One level Home Equipment: Environmental consultant - 2 wheels;Walker - 4 wheels;Cane - single point      Prior Function Level of Independence: Independent with assistive device(s)         Comments: using cane for limited distance and electric scooter for long distance     Hand Dominance        Extremity/Trunk Assessment   Upper Extremity Assessment Upper Extremity Assessment: Overall WFL for tasks assessed    Lower Extremity Assessment Lower Extremity Assessment: RLE deficits/detail    Cervical / Trunk Assessment Cervical / Trunk Assessment: Normal  Communication   Communication: No difficulties  Cognition Arousal/Alertness: Awake/alert Behavior During Therapy: WFL for tasks assessed/performed Overall Cognitive Status: Within Functional Limits for tasks assessed  General Comments      Exercises Total Joint Exercises Ankle Circles/Pumps: AROM;Both;10 reps;Supine   Assessment/Plan    PT Assessment Patient needs continued PT services  PT Problem  List Decreased strength;Decreased range of motion;Decreased activity tolerance;Decreased balance;Decreased mobility;Decreased knowledge of use of DME;Pain       PT Treatment Interventions DME instruction;Gait training;Stair training;Functional mobility training;Therapeutic activities;Therapeutic exercise;Patient/family education    PT Goals (Current goals can be found in the Care Plan section)  Acute Rehab PT Goals Patient Stated Goal: Regain IND PT Goal Formulation: With patient Time For Goal Achievement: 02/09/21 Potential to Achieve Goals: Good    Frequency 7X/week   Barriers to discharge        Co-evaluation               AM-PAC PT "6 Clicks" Mobility  Outcome Measure Help needed turning from your back to your side while in a flat bed without using bedrails?: A Lot Help needed moving from lying on your back to sitting on the side of a flat bed without using bedrails?: A Lot Help needed moving to and from a bed to a chair (including a wheelchair)?: A Lot Help needed standing up from a chair using your arms (e.g., wheelchair or bedside chair)?: A Lot Help needed to walk in hospital room?: A Little Help needed climbing 3-5 steps with a railing? : A Lot 6 Click Score: 13    End of Session Equipment Utilized During Treatment: Gait belt;Right knee immobilizer Activity Tolerance: Patient tolerated treatment well;Patient limited by fatigue Patient left: in chair;with call bell/phone within reach;with chair alarm set;with family/visitor present Nurse Communication: Mobility status PT Visit Diagnosis: Difficulty in walking, not elsewhere classified (R26.2)    Time: 9983-3825 PT Time Calculation (min) (ACUTE ONLY): 27 min   Charges:   PT Evaluation $PT Eval Low Complexity: 1 Low PT Treatments $Gait Training: 8-22 mins        [name] PT Acute Rehabilitation Services Pager [phone] Office [phone]   [name] 02/02/2021, 5:09 PM

## 2021-02-02 NOTE — Progress Notes (Signed)
Pharmacy contacted to re review pts MAR due to medications not noted that pt is currently taking.

## 2021-02-02 NOTE — Progress Notes (Signed)
Orthopedic Tech Progress Note Patient Details:  [name] [birth year] [id number]  Ortho Devices Type of Ortho Device: Bone foam zero knee   Post Interventions Patient Tolerated: Well Instructions Provided: Care of device  Saul Fordyce 02/02/2021, 5:51 PM

## 2021-02-03 DIAGNOSIS — M1711 Unilateral primary osteoarthritis, right knee: Secondary | ICD-10-CM | POA: Diagnosis not present

## 2021-02-03 LAB — CBC
HCT: 43.3 % (ref 39.0–52.0)
Hemoglobin: 14 g/dL (ref 13.0–17.0)
MCH: 30.4 pg (ref 26.0–34.0)
MCHC: 32.3 g/dL (ref 30.0–36.0)
MCV: 94.1 fL (ref 80.0–100.0)
Platelets: 221 10*3/uL (ref 150–400)
RBC: 4.6 MIL/uL (ref 4.22–5.81)
RDW: 16 % — ABNORMAL HIGH (ref 11.5–15.5)
WBC: 15 10*3/uL — ABNORMAL HIGH (ref 4.0–10.5)
nRBC: 0 % (ref 0.0–0.2)

## 2021-02-03 LAB — BASIC METABOLIC PANEL
Anion gap: 9 (ref 5–15)
BUN: 16 mg/dL (ref 8–23)
CO2: 25 mmol/L (ref 22–32)
Calcium: 8.4 mg/dL — ABNORMAL LOW (ref 8.9–10.3)
Chloride: 100 mmol/L (ref 98–111)
Creatinine, Ser: 0.75 mg/dL (ref 0.61–1.24)
GFR, Estimated: >60 mL/min (ref >60)
Glucose, Bld: 147 mg/dL — ABNORMAL HIGH (ref 70–99)
Potassium: 3.9 mmol/L (ref 3.5–5.1)
Sodium: 134 mmol/L — ABNORMAL LOW (ref 135–145)

## 2021-02-03 LAB — GLUCOSE, CAPILLARY
Glucose-Capillary: 158 mg/dL — ABNORMAL HIGH (ref 70–99)
Glucose-Capillary: 190 mg/dL — ABNORMAL HIGH (ref 70–99)
Glucose-Capillary: 232 mg/dL — ABNORMAL HIGH (ref 70–99)
Glucose-Capillary: 159 mg/dL — ABNORMAL HIGH (ref 70–99)

## 2021-02-03 NOTE — Progress Notes (Signed)
Physical Therapy Treatment Patient Details Name: [name] MRN: [id number] DOB: [birth year] Today's Date: 02/03/2021    History of Present Illness Pt s/p R TKR and with hx of L TKR, DM, CAD, MI, CABG, Back surgeries, and memory Deficits    PT Comments    Pt with marked improvement in activity tolerance and progressing well with mobility.   Follow Up Recommendations  Follow surgeon's recommendation for DC plan and follow-up therapies     Equipment Recommendations  None recommended by PT    Recommendations for Other Services       Precautions / Restrictions Precautions Precautions: Knee;Fall Required Braces or Orthoses: Knee Immobilizer - Right Knee Immobilizer - Right: Discontinue once straight leg raise with < 10 degree lag Restrictions Weight Bearing Restrictions: No Other Position/Activity Restrictions: WBAT    Mobility  Bed Mobility Overal bed mobility: Needs Assistance Bed Mobility: Supine to Sit     Supine to sit: Mod assist     General bed mobility comments: cues for sequence and use of L LE to self assist.  Physical assist to manage R LE and to control trunk    Transfers Overall transfer level: Needs assistance Equipment used: Rolling walker (2 wheeled) Transfers: Sit to/from Stand Sit to Stand: Min assist         General transfer comment: cues for LE management and use of UEs to self assist  Ambulation/Gait Ambulation/Gait assistance: Min assist Gait Distance (Feet): 110 Feet Assistive device: Rolling walker (2 wheeled) Gait Pattern/deviations: Step-to pattern;Decreased step length - right;Decreased step length - left;Shuffle;Trunk flexed Gait velocity: decr   General Gait Details: cues for sequence, posture and position from [employer]             Wheelchair Mobility    Modified Rankin (Stroke Patients Only)       Balance Overall balance assessment: Needs assistance Sitting-balance support: No upper extremity  supported;Feet supported Sitting balance-Leahy Scale: Fair     Standing balance support: Bilateral upper extremity supported Standing balance-Leahy Scale: Poor                              Cognition Arousal/Alertness: Awake/alert Behavior During Therapy: WFL for tasks assessed/performed Overall Cognitive Status: Within Functional Limits for tasks assessed                                        Exercises Total Joint Exercises Ankle Circles/Pumps: AROM;Both;Supine;15 reps Quad Sets: AROM;Both;10 reps;Supine Heel Slides: AAROM;Right;15 reps;Supine Straight Leg Raises: AAROM;10 reps;Right;Supine Goniometric ROM: -5 - 40 AAROM R knee    General Comments        Pertinent Vitals/Pain Pain Assessment: 0-10 Pain Score: 5  Pain Location: R knee Pain Descriptors / Indicators: Aching;Sore Pain Intervention(s): Limited activity within patient's tolerance;Monitored during session;Premedicated before session;Ice applied    Home Living                      Prior Function            PT Goals (current goals can now be found in the care plan section) Acute Rehab PT Goals Patient Stated Goal: Regain IND PT Goal Formulation: With patient Time For Goal Achievement: 02/09/21 Potential to Achieve Goals: Good Progress towards PT goals: Progressing toward goals    Frequency    7X/week  PT Plan Current plan remains appropriate    Co-evaluation              AM-PAC PT "6 Clicks" Mobility   Outcome Measure  Help needed turning from your back to your side while in a flat bed without using bedrails?: A Lot Help needed moving from lying on your back to sitting on the side of a flat bed without using bedrails?: A Lot Help needed moving to and from a bed to a chair (including a wheelchair)?: A Little Help needed standing up from a chair using your arms (e.g., wheelchair or bedside chair)?: A Little Help needed to walk in hospital room?: A  Little Help needed climbing 3-5 steps with a railing? : A Lot 6 Click Score: 15    End of Session Equipment Utilized During Treatment: Gait belt;Right knee immobilizer Activity Tolerance: Patient tolerated treatment well Patient left: in chair;with call bell/phone within reach;with chair alarm set;with family/visitor present Nurse Communication: Mobility status PT Visit Diagnosis: Difficulty in walking, not elsewhere classified (R26.2)     Time: 1749-4496 PT Time Calculation (min) (ACUTE ONLY): 35 min  Charges:  $Gait Training: 8-22 mins $Therapeutic Exercise: 8-22 mins                     Mauro Kaufmann PT Acute Rehabilitation Services Pager [phone] Office [phone]    [facility] 02/03/2021, 12:33 PM

## 2021-02-03 NOTE — Progress Notes (Signed)
    Subjective:  Patient reports pain as mild to moderate.  Denies N/V/CP/SOB.   Objective:   VITALS:   Vitals:   02/02/21 2036 02/03/21 0012 02/03/21 0457 02/03/21 0955  BP: (!) 160/85 (!) 160/78 (!) 157/79 (!) 155/81  Pulse: 91 74 (!) 57 74  Resp: 18 17 20 18   Temp:    98.4 F (36.9 C)  TempSrc:    Oral  SpO2: 99% 95% 96% 91%  Weight:      Height:        NAD ABD soft Neurovascular intact Sensation intact distally Intact pulses distally Dorsiflexion/Plantar flexion intact Incision: dressing C/D/I   Lab Results  Component Value Date   WBC 15.0 (H) 02/03/2021   HGB 14.0 02/03/2021   HCT 43.3 02/03/2021   MCV 94.1 02/03/2021   PLT 221 02/03/2021   BMET    Component Value Date/Time   NA 134 (L) 02/03/2021 0337   K 3.9 02/03/2021 0337   CL 100 02/03/2021 0337   CO2 25 02/03/2021 0337   GLUCOSE 147 (H) 02/03/2021 0337   BUN 16 02/03/2021 0337   CREATININE 0.75 02/03/2021 0337   CALCIUM 8.4 (L) 02/03/2021 0337   GFRNONAA >60 02/03/2021 02/05/2021     Assessment/Plan: 1 Day Post-Op   Active Problems:   Status post total knee replacement, right   WBAT with walker DVT ppx: Aspirin, SCDs, TEDS PO pain control PT/OT Dispo: D/C planned for Sunday     Thursday 02/03/2021, 10:25 AM [facility] is now [facility] [address], [place], [place] Phone: [phone] [email or web address] Facebook  [phone]

## 2021-02-03 NOTE — Op Note (Signed)
[name], [name] MEDICAL RECORD NO: [id number] ACCOUNT NO: [id number] DATE OF BIRTH: Apr 26, 1946 FAC[facility]: [facility] PHYSICIAN: Almedia Balls. Ranell Patrick, MD  Operative Report   DATE OF PROCEDURE: 02/02/2021  PREOPERATIVE DIAGNOSIS:  Right knee end-stage osteoarthritis.  POSTOPERATIVE DIAGNOSIS:  Right knee end-stage osteoarthritis.  PROCEDURE PERFORMED:  Right total knee arthroplasty using DePuy Attune prosthesis.  ATTENDING SURGEON:  Almedia Balls. Ranell Patrick, MD.  ASSISTANT:  Konrad Felix Dixon, [place], who was scrubbed during the entire procedure, and necessary for satisfactory completion of surgery.  ANESTHESIA:  Spinal anesthesia was used plus adductor canal block.  ESTIMATED BLOOD LOSS:  Minimal.  FLUID REPLACEMENT:  1500 mL crystalloid.  COUNTS:  Instrument counts were correct.  COMPLICATIONS:  No complications.  ANTIBIOTICS:  Perioperative antibiotics were given.  TOURNIQUET TIME:  1 hour and 20 minutes at 300 mmHg.  INDICATIONS:  The patient is a 75 year old male with a history of worsening right knee pain secondary to bone-on-bone arthritis.  The patient has had progressive pain despite conservative management, desires operative treatment to restore function and to  eliminate pain.  Informed consent obtained.  DESCRIPTION OF PROCEDURE:  After an adequate level of spinal anesthesia was achieved, the patient positioned supine on the operating room table.  Right leg correctly identified and a nonsterile tourniquet placed on the proximal thigh.  Right leg  sterilely prepped and draped in the usual manner.  Time out called, verifying correct patient, correct site.  We then elevated the leg and exsanguinated with an Esmarch bandage.  We elevated the tourniquet to 300 mmHg.  We then placed the knee in flexion  and performed a midline incision with a 10 blade scalpel, dissected down through the subcutaneous tissues.  We then used a fresh #10 blade to perform a medial parapatellar  arthrotomy.  We then divided the lateral patellofemoral ligaments everting the  patella.  There was a complete cartilage loss, both on the femur and the tibia, more so in the medial compartment.  Once we had the patella everted and distal femur exposed, we entered the distal femur with a step cut drill.  We then placed our  intramedullary resection guide and resected 9 mm off the distal femur, 5 degrees right.  We then sized our femur to a size 7 anterior down and performed anterior, posterior and chamfer cuts with the 4-in-1 block.  Next, we removed ACL, PCL and meniscal  tissue.  We subluxed the tibia anteriorly.  We then used an external alignment guide to cut our tibia.  We resected 2 mm off the affected medial side 90 degrees perpendicular to the long axis of the tibia with minimal posterior slope using the  oscillating saw.  We then used a lamina spreader, removed osteophytes off the posterior femoral condyles.  We then injected the back of the knee with a mixture of Marcaine, Exparel and saline.  Next, we checked our flexion and extension gaps, which were  symmetric at 8 mm.  We then removed our tibial pins and completed our tibial preparation with modular drill and keel punch with the size 7 tibia rotating externally as far as we could to relatively internally rotate the tibial tubercle for patellar  tracking.  Next, we performed our box cut for the posterior cruciate substituting femoral prosthesis size 7 right, we impacted that in place, drilled our lug holes out.  We then placed an 8 polyethylene trial and reduced the knee.  We were pleased with  our soft tissue balancing.  We were able to get slightly hyperextended.  Thus, we felt like we probably fit 10 in for the real one eventually.  We then resurfaced the patella going from 25 mm thickness down to 15 mm thickness drilling lug holes for the  38 patellar button.  We ranged the knee, had excellent patellar tracking, no-touch technique.  We  removed all trial components, irrigated thoroughly and then vacuum mixed high viscosity cement on the back table.  After drying the bone, we cemented the  components into place the tibia, femur and patella, all in one step.  We placed the knee in extension with an 8 mm spacer and allowed the cement to harden.  Once the cement was hardened, we removed excess cement with 0.25 inch curved osteotome.  We then  removed the trial size 8 and selected the real size 10 mm spacer and placed that on the tibial component and reduced the knee.  We had a nice little pop as the medial side reduced.  We had good stability in flexion and extension and had a nice full  extension.  We irrigated again.  We then injected the capsule with a combination of Marcaine, saline and Exparel.  Next, we closed the parapatellar arthrotomy with #1 Vicryl suture, followed by 2-0 Vicryl for subcutaneous closure and 4-0 Monocryl for  skin.  Steri-Strips were applied followed by sterile dressing.  The patient tolerated surgery well.   PUS D: 02/02/2021 12:08:45 pm T: 02/03/2021 4:02:00 am  JOB: [id number]/ [id number]

## 2021-02-03 NOTE — Progress Notes (Signed)
Physical Therapy Treatment Patient Details Name: [name] MRN: [id number] DOB: [birth year] Today's Date: 02/03/2021    History of Present Illness Pt s/p R TKR and with hx of L TKR, DM, CAD, MI, CABG, Back surgeries, and memory Deficits    PT Comments    Pt continues very motivated and progressing with mobility but with decreased activity tolerance this pm 2* increased pain.   Follow Up Recommendations  Follow surgeon's recommendation for DC plan and follow-up therapies     Equipment Recommendations  None recommended by PT    Recommendations for Other Services       Precautions / Restrictions Precautions Precautions: Knee;Fall Required Braces or Orthoses: Knee Immobilizer - Right Knee Immobilizer - Right: Discontinue once straight leg raise with < 10 degree lag Restrictions Weight Bearing Restrictions: No Other Position/Activity Restrictions: WBAT    Mobility  Bed Mobility Overal bed mobility: Needs Assistance Bed Mobility: Sit to Supine       Sit to supine: Min assist   General bed mobility comments: cues for sequence and use of L LE to self assist.  Physical assist to manage R LE    Transfers Overall transfer level: Needs assistance Equipment used: Rolling walker (2 wheeled) Transfers: Sit to/from Stand Sit to Stand: Min assist         General transfer comment: cues for LE management and use of UEs to self assist  Ambulation/Gait Ambulation/Gait assistance: Min guard Gait Distance (Feet): 95 Feet Assistive device: Rolling walker (2 wheeled) Gait Pattern/deviations: Step-to pattern;Decreased step length - right;Decreased step length - left;Shuffle;Trunk flexed Gait velocity: decr   General Gait Details: cues for sequence, posture and position from [employer]             Wheelchair Mobility    Modified Rankin (Stroke Patients Only)       Balance Overall balance assessment: Needs assistance Sitting-balance support: No upper  extremity supported;Feet supported       Standing balance support: Bilateral upper extremity supported Standing balance-Leahy Scale: Poor                              Cognition Arousal/Alertness: Awake/alert Behavior During Therapy: WFL for tasks assessed/performed Overall Cognitive Status: Within Functional Limits for tasks assessed                                        Exercises      General Comments        Pertinent Vitals/Pain Pain Assessment: 0-10 Pain Score: 6  Pain Location: R knee Pain Descriptors / Indicators: Aching;Sore Pain Intervention(s): Limited activity within patient's tolerance;Monitored during session;Premedicated before session;Ice applied    Home Living                      Prior Function            PT Goals (current goals can now be found in the care plan section) Acute Rehab PT Goals Patient Stated Goal: Regain IND PT Goal Formulation: With patient Time For Goal Achievement: 02/09/21 Potential to Achieve Goals: Good Progress towards PT goals: Progressing toward goals    Frequency    7X/week      PT Plan Current plan remains appropriate    Co-evaluation  AM-PAC PT "6 Clicks" Mobility   Outcome Measure  Help needed turning from your back to your side while in a flat bed without using bedrails?: A Lot Help needed moving from lying on your back to sitting on the side of a flat bed without using bedrails?: A Lot Help needed moving to and from a bed to a chair (including a wheelchair)?: A Little Help needed standing up from a chair using your arms (e.g., wheelchair or bedside chair)?: A Little Help needed to walk in hospital room?: A Little Help needed climbing 3-5 steps with a railing? : A Lot 6 Click Score: 15    End of Session Equipment Utilized During Treatment: Gait belt;Right knee immobilizer Activity Tolerance: Patient tolerated treatment well Patient left: with call  bell/phone within reach;with family/visitor present;in bed;with bed alarm set Nurse Communication: Mobility status PT Visit Diagnosis: Difficulty in walking, not elsewhere classified (R26.2)     Time: 3845-3646 PT Time Calculation (min) (ACUTE ONLY): 23 min  Charges:  $Gait Training: 8-22 mins                     Mauro Kaufmann PT Acute Rehabilitation Services Pager [phone] Office [phone]    [facility] 02/03/2021, 4:32 PM

## 2021-02-04 DIAGNOSIS — M1711 Unilateral primary osteoarthritis, right knee: Secondary | ICD-10-CM | POA: Diagnosis not present

## 2021-02-04 LAB — CBC
HCT: 41.5 % (ref 39.0–52.0)
Hemoglobin: 13.6 g/dL (ref 13.0–17.0)
MCH: 30.6 pg (ref 26.0–34.0)
MCHC: 32.8 g/dL (ref 30.0–36.0)
MCV: 93.3 fL (ref 80.0–100.0)
Platelets: 196 10*3/uL (ref 150–400)
RBC: 4.45 MIL/uL (ref 4.22–5.81)
RDW: 15.8 % — ABNORMAL HIGH (ref 11.5–15.5)
WBC: 15.6 10*3/uL — ABNORMAL HIGH (ref 4.0–10.5)
nRBC: 0 % (ref 0.0–0.2)

## 2021-02-04 LAB — GLUCOSE, CAPILLARY
Glucose-Capillary: 109 mg/dL — ABNORMAL HIGH (ref 70–99)
Glucose-Capillary: 191 mg/dL — ABNORMAL HIGH (ref 70–99)

## 2021-02-04 NOTE — Progress Notes (Signed)
Physical Therapy Treatment Patient Details Name: [name] MRN: [id number] DOB: [birth year] Today's Date: 02/04/2021    History of Present Illness Pt s/p R TKR and with hx of L TKR, DM, CAD, MI, CABG, Back surgeries, and memory Deficits    PT Comments    Pt performed TKR therex with assist and with written instruction provided.  Pt and spouse reviewed don/doff KI and pt assisted from bed to chair for bfast.  Will return to complete stair training.  Pt hopeful for dc home this date.  Follow Up Recommendations  Follow surgeon's recommendation for DC plan and follow-up therapies     Equipment Recommendations  None recommended by PT    Recommendations for Other Services       Precautions / Restrictions Precautions Precautions: Knee;Fall Required Braces or Orthoses: Knee Immobilizer - Right Knee Immobilizer - Right: Discontinue once straight leg raise with < 10 degree lag Restrictions Weight Bearing Restrictions: No Other Position/Activity Restrictions: WBAT    Mobility  Bed Mobility Overal bed mobility: Needs Assistance Bed Mobility: Supine to Sit     Supine to sit: Min assist;Mod assist     General bed mobility comments: cues for sequence and use of L LE to self assist.  Physical assist to manage R LE and to bring trunk to upright - pt limited by previous back surgery and states he will initially stay in lift chair    Transfers Overall transfer level: Needs assistance Equipment used: Rolling walker (2 wheeled) Transfers: Sit to/from Stand Sit to Stand: Min guard         General transfer comment: min cues for LE management and use of UEs to self assist  Ambulation/Gait Ambulation/Gait assistance: Min guard Gait Distance (Feet): 6 Feet Assistive device: Rolling walker (2 wheeled) Gait Pattern/deviations: Step-to pattern;Decreased step length - right;Decreased step length - left;Shuffle;Trunk flexed Gait velocity: decr   General Gait Details: cues for  sequence, posture and position from [employer]             Wheelchair Mobility    Modified Rankin (Stroke Patients Only)       Balance Overall balance assessment: Needs assistance Sitting-balance support: No upper extremity supported;Feet supported Sitting balance-Leahy Scale: Fair     Standing balance support: No upper extremity supported Standing balance-Leahy Scale: Fair                              Cognition Arousal/Alertness: Awake/alert Behavior During Therapy: WFL for tasks assessed/performed Overall Cognitive Status: Within Functional Limits for tasks assessed                                        Exercises Total Joint Exercises Ankle Circles/Pumps: AROM;Both;Supine;15 reps Quad Sets: AROM;Both;10 reps;Supine Heel Slides: AAROM;Right;Supine;20 reps Hip ABduction/ADduction: AAROM;Right;10 reps;Supine Straight Leg Raises: AAROM;Right;Supine;20 reps Goniometric ROM: -5- 50 AAROM R knee    General Comments        Pertinent Vitals/Pain Pain Assessment: 0-10 Pain Score: 5  Pain Location: R knee Pain Descriptors / Indicators: Aching;Sore Pain Intervention(s): Limited activity within patient's tolerance;Monitored during session;Premedicated before session;Ice applied    Home Living                      Prior Function            PT  Goals (current goals can now be found in the care plan section) Acute Rehab PT Goals Patient Stated Goal: Regain IND PT Goal Formulation: With patient Time For Goal Achievement: 02/09/21 Potential to Achieve Goals: Good Progress towards PT goals: Progressing toward goals    Frequency    7X/week      PT Plan Current plan remains appropriate    Co-evaluation              AM-PAC PT "6 Clicks" Mobility   Outcome Measure  Help needed turning from your back to your side while in a flat bed without using bedrails?: A Lot Help needed moving from lying on your back to  sitting on the side of a flat bed without using bedrails?: A Lot Help needed moving to and from a bed to a chair (including a wheelchair)?: A Little Help needed standing up from a chair using your arms (e.g., wheelchair or bedside chair)?: A Little Help needed to walk in hospital room?: A Little Help needed climbing 3-5 steps with a railing? : A Lot 6 Click Score: 15    End of Session Equipment Utilized During Treatment: Gait belt;Right knee immobilizer Activity Tolerance: Patient tolerated treatment well Patient left: with call bell/phone within reach;with family/visitor present;in bed;with bed alarm set Nurse Communication: Mobility status PT Visit Diagnosis: Difficulty in walking, not elsewhere classified (R26.2)     Time: 1610-9604 PT Time Calculation (min) (ACUTE ONLY): 35 min  Charges:  $Therapeutic Exercise: 8-22 mins $Therapeutic Activity: 8-22 mins                     Mauro Kaufmann PT Acute Rehabilitation Services Pager [phone] Office [phone]    Irja Wheless 02/04/2021, 8:48 AM

## 2021-02-04 NOTE — Progress Notes (Signed)
Pt discharged to home. DC instructions given with wife at bedside. No concerns voiced. Pt and wife encouraged to stop by [facility] clinic pharmacy in [place] and pick up meds that were e-prescribed by Provider. Voiced understanding. Pt left unit in wheelchair pushed by nurse tech. Left in stable condition.  VWilliams,RN.

## 2021-02-04 NOTE — Progress Notes (Signed)
Subjective: 2 Days Post-Op Procedure(s) (LRB): TOTAL KNEE ARTHROPLASTY (Right) Seen in rounds for Dr. Ranell Patrick Patient reports pain as mild.   Doing well, has passed PT this morning is going to be working on steps  Objective: Vital signs in last 24 hours: Temp:  [98 F (36.7 C)-98.5 F (36.9 C)] 98 F (36.7 C) (08/14 0539) Pulse Rate:  [70-72] 70 (08/14 0539) Resp:  [18-20] 19 (08/14 0539) BP: (119-161)/(70-73) 119/71 (08/14 0539) SpO2:  [92 %-97 %] 93 % (08/14 0539)  Intake/Output from previous day: 08/13 0701 - 08/14 0700 In: 1440 [P.O.:1440] Out: 2150 [Urine:2150] Intake/Output this shift: No intake/output data recorded.  Recent Labs    02/03/21 0337 02/04/21 0307  HGB 14.0 13.6   Recent Labs    02/03/21 0337 02/04/21 0307  WBC 15.0* 15.6*  RBC 4.60 4.45  HCT 43.3 41.5  PLT 221 196   Recent Labs    02/03/21 0337  NA 134*  K 3.9  CL 100  CO2 25  BUN 16  CREATININE 0.75  GLUCOSE 147*  CALCIUM 8.4*   No results for input(s): LABPT, INR in the last 72 hours.  Neurologically intact Neurovascular intact Sensation intact distally Intact pulses distally Dorsiflexion/Plantar flexion intact Incision: dressing C/D/I No cellulitis present Compartment soft   Assessment/Plan: 2 Days Post-Op Procedure(s) (LRB): TOTAL KNEE ARTHROPLASTY (Right) Advance diet Up with therapy Weightbearing as tolerated with walker As long as cleared by therapy after step management today he is okay to be discharged home this afternoon   [facility] [phone] 02/04/2021, 10:35 AM

## 2021-02-04 NOTE — Plan of Care (Signed)
  Problem: Education: Goal: Knowledge of General Education information will improve Description: Including pain rating scale, medication(s)/side effects and non-pharmacologic comfort measures Outcome: Progressing   Problem: Coping: Goal: Level of anxiety will decrease Outcome: Progressing   Problem: Pain Managment: Goal: General experience of comfort will improve Outcome: Progressing   Problem: Skin Integrity: Goal: Risk for impaired skin integrity will decrease Outcome: Progressing   

## 2021-02-04 NOTE — Progress Notes (Signed)
Orthopedic Tech Progress Note Patient Details:  [name] 1945/12/03 [id number]  Patient ID: Danton Sewer, male   DOB: 1945-10-18, 75 y.o.   MRN: [id number]  Saul Fordyce 02/04/2021, 11:36 AMPickup CPM and OHF.

## 2021-02-04 NOTE — Progress Notes (Signed)
Physical Therapy Treatment Patient Details Name: [name] MRN: [id number] DOB: April 07, 1946 Today's Date: 02/04/2021    History of Present Illness Pt s/p R TKR and with hx of L TKR, DM, CAD, MI, CABG, Back surgeries, and memory Deficits    PT Comments    Pt up to ambulate limited distance in hall and negotiated stairs - wife present and written instruction provided.  Pt eager for dc home.   Follow Up Recommendations  Follow surgeon's recommendation for DC plan and follow-up therapies     Equipment Recommendations  None recommended by PT    Recommendations for Other Services       Precautions / Restrictions Precautions Precautions: Knee;Fall Required Braces or Orthoses: Knee Immobilizer - Right Knee Immobilizer - Right: Discontinue once straight leg raise with < 10 degree lag Restrictions Weight Bearing Restrictions: No Other Position/Activity Restrictions: WBAT    Mobility  Bed Mobility               General bed mobility comments: Pt up in chair and requests back to same    Transfers Overall transfer level: Needs assistance Equipment used: Rolling walker (2 wheeled) Transfers: Sit to/from Stand Sit to Stand: Min guard         General transfer comment: min cues for LE management and use of UEs to self assist; pt will be using lift chair at home  Ambulation/Gait Ambulation/Gait assistance: Min guard;Supervision Gait Distance (Feet): 55 Feet Assistive device: Rolling walker (2 wheeled) Gait Pattern/deviations: Step-to pattern;Decreased step length - right;Decreased step length - left;Shuffle;Trunk flexed Gait velocity: decr   General Gait Details: pt self-cues for sequence, posture and position from RW   Stairs Stairs: Yes   Stair Management: One rail Left;Step to pattern;Forwards;With crutches Number of Stairs: 3 General stair comments: cues for sequence and foot/crutch placement   Wheelchair Mobility    Modified Rankin (Stroke Patients  Only)       Balance Overall balance assessment: Needs assistance Sitting-balance support: No upper extremity supported;Feet supported Sitting balance-Leahy Scale: Fair     Standing balance support: No upper extremity supported Standing balance-Leahy Scale: Fair                              Cognition Arousal/Alertness: Awake/alert Behavior During Therapy: WFL for tasks assessed/performed Overall Cognitive Status: Within Functional Limits for tasks assessed                                        Exercises      General Comments        Pertinent Vitals/Pain Pain Assessment: 0-10 Pain Score: 5  Pain Location: R knee Pain Descriptors / Indicators: Aching;Sore Pain Intervention(s): Limited activity within patient's tolerance;Monitored during session;Patient requesting pain meds-RN notified;Ice applied    Home Living                      Prior Function            PT Goals (current goals can now be found in the care plan section) Acute Rehab PT Goals Patient Stated Goal: Regain IND PT Goal Formulation: With patient Time For Goal Achievement: 02/09/21 Potential to Achieve Goals: Good Progress towards PT goals: Progressing toward goals    Frequency    7X/week      PT Plan Current plan remains  appropriate    Co-evaluation              AM-PAC PT "6 Clicks" Mobility   Outcome Measure  Help needed turning from your back to your side while in a flat bed without using bedrails?: A Lot Help needed moving from lying on your back to sitting on the side of a flat bed without using bedrails?: A Lot Help needed moving to and from a bed to a chair (including a wheelchair)?: A Little Help needed standing up from a chair using your arms (e.g., wheelchair or bedside chair)?: A Little Help needed to walk in hospital room?: A Little Help needed climbing 3-5 steps with a railing? : A Little 6 Click Score: 16    End of Session  Equipment Utilized During Treatment: Gait belt;Right knee immobilizer Activity Tolerance: Patient tolerated treatment well Patient left: with call bell/phone within reach;with family/visitor present;in chair;with chair alarm set Nurse Communication: Mobility status PT Visit Diagnosis: Difficulty in walking, not elsewhere classified (R26.2)     Time: 0569-7948 PT Time Calculation (min) (ACUTE ONLY): 17 min  Charges:  $Gait Training: 8-22 mins                     Mauro Kaufmann PT Acute Rehabilitation Services Pager [phone] Office [phone]    [place] [facility] 02/04/2021, 12:23 PM

## 2021-02-04 NOTE — TOC Transition Note (Signed)
Transition of Care [facility]) - CM/SW Discharge Note   Patient Details  Name: [name] MRN: [id number] Date of Birth: 06-19-1946  Transition of Care [facility]) CM/SW Contact:  Lennart Pall, LCSW Phone Number: 02/04/2021, 10:19 AM   Clinical Narrative:    Met briefly with pt and spouse and confirming he has all needed DME at home.  Plan for OPPT in [place] on Tuesday.  No further TOC needs,   Final next level of care: OP Rehab Barriers to Discharge: No Barriers Identified   Patient Goals and CMS Choice Patient states their goals for this hospitalization and ongoing recovery are:: return home      Discharge Placement                       Discharge Plan and Services                DME Arranged: N/A DME Agency: NA                  Social Determinants of Health (SDOH) Interventions     Readmission Risk Interventions No flowsheet data found.

## 2021-02-06 ENCOUNTER — Encounter (HOSPITAL_COMMUNITY): Payer: Self-pay | Admitting: Orthopedic Surgery

## 2021-02-11 NOTE — Discharge Summary (Signed)
In most cases prophylactic antibiotics for Dental procdeures after total joint surgery are not necessary.  Exceptions are as follows:  1. History of prior total joint infection  2. Severely immunocompromised (Organ Transplant, cancer chemotherapy, Rheumatoid biologic meds such as Humera)  3. Poorly controlled diabetes (A1C &gt; 8.0, blood glucose over 200)  If you have one of these conditions, contact your surgeon for an antibiotic prescription, prior to your dental procedure. Orthopedic Discharge Summary        Physician Discharge Summary  Patient ID: [name] MRN: [id number] DOB/AGE: 1946/05/28 75 y.o.  Admit date: 02/02/2021 Discharge date:  02/04/21  Procedures:  Procedure(s) (LRB): TOTAL KNEE ARTHROPLASTY (Right)  Attending Physician:  Dr. Malon Kindle  Admission Diagnoses:   right knee end stage osteoarthritis  Discharge Diagnoses:  right knee end stage osteoarthritis   Past Medical History:  Diagnosis Date   Arthritis    Coronary artery disease    Diabetes mellitus    Headache    Heart murmur    as a child   Hypertension    Memory deficit    pm aroce[t and namenda alert and oriented  has memory issues at times   Myocardial infarction [facility])    Sleep apnea     PCP: [facility]   Discharged Condition: good  Hospital Course:  Patient underwent the above stated procedure on 02/02/2021. Patient tolerated the procedure well and brought to the recovery room in good condition and subsequently to the floor. Patient had an uncomplicated hospital course and was stable for discharge.   Disposition: Discharge disposition: 01-Home or Self Care      with follow up in 2 weeks    Follow-up Information     Beverely Low, MD Follow up in 2 week(s).   Specialty: Orthopedic Surgery Why: Call [phone] for appt Contact information: [address] [place] [address] [phone]                 Dental  Antibiotics:  In most cases prophylactic antibiotics for Dental procdeures after total joint surgery are not necessary.  Exceptions are as follows:  1. History of prior total joint infection  2. Severely immunocompromised (Organ Transplant, cancer chemotherapy, Rheumatoid biologic meds such as Humera)  3. Poorly controlled diabetes (A1C &gt; 8.0, blood glucose over 200)  If you have one of these conditions, contact your surgeon for an antibiotic prescription, prior to your dental procedure.  Discharge Instructions     Call MD / Call 911   Complete by: As directed    If you experience chest pain or shortness of breath, CALL 911 and be transported to the hospital emergency room.  If you develope a fever above 101 F, pus (white drainage) or increased drainage or redness at the wound, or calf pain, call your surgeon's office.   Constipation Prevention   Complete by: As directed    Drink plenty of fluids.  Prune juice may be helpful.  You may use a stool softener, such as Colace (over the counter) 100 mg twice a day.  Use MiraLax (over the counter) for constipation as needed.   Diet - low sodium heart healthy   Complete by: As directed    Do not put a pillow under the knee. Place it under the heel.   Complete by: As directed    Driving restrictions   Complete by: As directed    No driving for two weeks   Post-operative opioid taper  instructions:   Complete by: As directed    POST-OPERATIVE OPIOID TAPER INSTRUCTIONS: It is important to wean off of your opioid medication as soon as possible. If you do not need pain medication after your surgery it is ok to stop day one. Opioids include: Codeine, Hydrocodone(Norco, Vicodin), Oxycodone(Percocet, oxycontin) and hydromorphone amongst others.  Long term and even short term use of opiods can cause: Increased pain response Dependence Constipation Depression Respiratory depression And more.  Withdrawal symptoms can include Flu like  symptoms Nausea, vomiting And more Techniques to manage these symptoms Hydrate well Eat regular healthy meals Stay active Use relaxation techniques(deep breathing, meditating, yoga) Do Not substitute Alcohol to help with tapering If you have been on opioids for less than two weeks and do not have pain than it is ok to stop all together.  Plan to wean off of opioids This plan should start within one week post op of your joint replacement. Maintain the same interval or time between taking each dose and first decrease the dose.  Cut the total daily intake of opioids by one tablet each day Next start to increase the time between doses. The last dose that should be eliminated is the evening dose.      TED hose   Complete by: As directed    Use stockings (TED hose) for three weeks on both leg(s).  You may remove them at night for sleeping.   Weight bearing as tolerated   Complete by: As directed        Allergies as of 02/04/2021       Reactions   Methocarbamol Other (See Comments)   Hallucinations.  Took with Percocet, so not sure which medication caused this, if not the combination of the two.   Oxycodone-acetaminophen Other (See Comments)   Hallucinations.  Took with Methocarbamol, so not sure which medication caused this, if not the combination of the two.   Aricept [donepezil] Other (See Comments)   Dream Disorder   Oxybutynin Other (See Comments)   Dream disorder   Tape Rash   AT SITE, HAS DONE WELL WITH SURGICAL TAPE        Medication List     TAKE these medications    amLODipine 10 MG tablet Commonly known as: NORVASC Take 10 mg by mouth daily.   aspirin EC 81 MG tablet Take 1 tablet (81 mg total) by mouth in the morning and at bedtime. What changed: when to take this   Cholecalciferol 25 MCG (1000 UT) tablet Take 1,000 Units by mouth daily.   cloNIDine 0.1 MG tablet Commonly known as: CATAPRES Take 0.1 mg by mouth daily.   DULoxetine 30 MG  capsule Commonly known as: CYMBALTA Take 30-60 mg by mouth in the morning and at bedtime. 30mg  daily and 60mg  at bedtime   empagliflozin 25 MG Tabs tablet Commonly known as: JARDIANCE Take 12.5 mg by mouth daily.   escitalopram 10 MG tablet Commonly known as: LEXAPRO Take 10 mg by mouth daily.   ferrous sulfate 325 (65 FE) MG tablet Take 325 mg by mouth daily with breakfast.   glipiZIDE 10 MG tablet Commonly known as: GLUCOTROL Take 10 mg by mouth 2 (two) times daily before a meal.   HYDROcodone-acetaminophen 5-325 MG tablet Commonly known as: Norco Take 1-2 tablets by mouth every 6 (six) hours as needed for moderate pain or severe pain.   insulin glargine 100 UNIT/ML injection Commonly known as: LANTUS Inject 50 Units into the skin at bedtime.  losartan 100 MG tablet Commonly known as: COZAAR Take 100 mg by mouth daily.   meclizine 25 MG tablet Commonly known as: ANTIVERT Take 25 mg by mouth 2 (two) times daily as needed for dizziness.   memantine 10 MG tablet Commonly known as: NAMENDA Take 20 mg by mouth at bedtime.   metFORMIN 1000 MG tablet Commonly known as: GLUCOPHAGE Take 1,000 mg by mouth 2 (two) times daily with a meal.   methocarbamol 750 MG tablet Commonly known as: ROBAXIN Take 325 mg by mouth 3 (three) times daily. What changed: Another medication with the same name was added. Make sure you understand how and when to take each.   methocarbamol 500 MG tablet Commonly known as: Robaxin Take 1 tablet (500 mg total) by mouth every 6 (six) hours as needed for muscle spasms. What changed: You were already taking a medication with the same name, and this prescription was added. Make sure you understand how and when to take each.   montelukast 10 MG tablet Commonly known as: SINGULAIR Take 10 mg by mouth at bedtime.   multivitamin with minerals tablet Take 1 tablet by mouth daily.   omeprazole 20 MG capsule Commonly known as: PRILOSEC Take 20 mg by  mouth daily.   OXcarbazepine 150 MG tablet Commonly known as: TRILEPTAL Take 150 mg by mouth in the morning, at noon, and at bedtime.   pravastatin 80 MG tablet Commonly known as: PRAVACHOL Take 80 mg by mouth at bedtime.   propranolol 10 MG tablet Commonly known as: INDERAL Take 10 mg by mouth 2 (two) times daily.   ranolazine 500 MG 12 hr tablet Commonly known as: RANEXA Take 500 mg by mouth 2 (two) times daily.   tamsulosin 0.4 MG Caps capsule Commonly known as: FLOMAX Take 0.4 mg by mouth daily. Takes in am   Testosterone 20.25 MG/1.25GM (1.62%) Gel Place 2 Pump onto the skin daily.   trospium 20 MG tablet Commonly known as: SANCTURA Take 20 mg by mouth 2 (two) times daily.               Discharge Care Instructions  (From admission, onward)           Start     Ordered   02/04/21 0000  Weight bearing as tolerated        02/04/21 1038              Signed: Thea Gist 02/11/2021, 8:13 PM  [facility] is now [facility] [address], [address], [place], [place] [address] Phone: [phone] Facebook  Instagram  [employer]

## 2021-07-23 ENCOUNTER — Other Ambulatory Visit: Payer: Self-pay | Admitting: Orthopaedic Surgery

## 2021-07-23 DIAGNOSIS — M4325 Fusion of spine, thoracolumbar region: Secondary | ICD-10-CM

## 2021-07-23 DIAGNOSIS — M4322 Fusion of spine, cervical region: Secondary | ICD-10-CM

## 2021-07-24 ENCOUNTER — Other Ambulatory Visit: Payer: Self-pay

## 2021-07-24 ENCOUNTER — Ambulatory Visit: Admission: RE | Admit: 2021-07-24 | Payer: Medicare Other | Source: Ambulatory Visit

## 2021-07-24 ENCOUNTER — Other Ambulatory Visit: Payer: Self-pay | Admitting: Orthopaedic Surgery

## 2021-07-24 ENCOUNTER — Ambulatory Visit
Admission: RE | Admit: 2021-07-24 | Discharge: 2021-07-24 | Disposition: A | Discharge status: Home / Self Care | Payer: Medicare Other | Source: Ambulatory Visit | Attending: Orthopaedic Surgery | Admitting: Orthopaedic Surgery

## 2021-07-24 DIAGNOSIS — M4322 Fusion of spine, cervical region: Secondary | ICD-10-CM

## 2021-07-24 DIAGNOSIS — M4325 Fusion of spine, thoracolumbar region: Secondary | ICD-10-CM

## 2021-07-24 DIAGNOSIS — M5136 Other intervertebral disc degeneration, lumbar region: Secondary | ICD-10-CM

## 2021-07-24 MED ORDER — IOPAMIDOL (ISOVUE-M 300) INJECTION 61%
10.0000 mL | Freq: Once | INTRAMUSCULAR | Status: AC | PRN
  Administered 2021-07-24: 10 mL via INTRATHECAL

## 2021-07-24 MED ORDER — ONDANSETRON HCL 4 MG/2ML IJ SOLN: 4.0000 mg | Freq: Once | INTRAMUSCULAR | Status: DC | PRN

## 2021-07-24 MED ORDER — MEPERIDINE HCL 50 MG/ML IJ SOLN: 50.0000 mg | Freq: Once | INTRAMUSCULAR | Status: DC | PRN

## 2021-07-24 MED ORDER — DIAZEPAM 5 MG PO TABS
5.0000 mg | ORAL_TABLET | Freq: Once | ORAL | Status: AC
  Administered 2021-07-24: 5 mg via ORAL

## 2021-07-24 NOTE — Discharge Instructions (Signed)

## 2024-01-06 IMAGING — CT CT T SPINE W/ CM
3 series · 8 of 14 positions shown, 9 images · non-contrast
Comparison: none

CLINICAL DATA: Neck pain and thoracic region back pain. Multiple
spinal fusions.
TECHNIQUE: Contiguous axial images were obtained through the Cervical and
Thoracic spine after the intrathecal infusion of infusion. Coronal
and sagittal reconstructions were obtained of the axial image sets.

[Series 3: l spine soft · axial · 0.29mm/px · z∈[+333,+481]mm · 3 of 150 slices shown]
[im 38/150  soft-tissue]
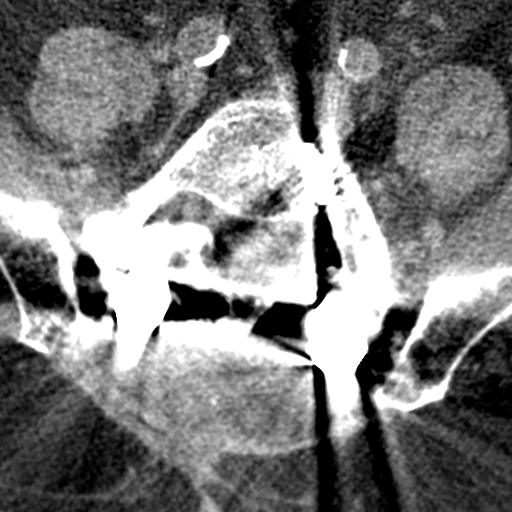
[im 75/150  soft-tissue]
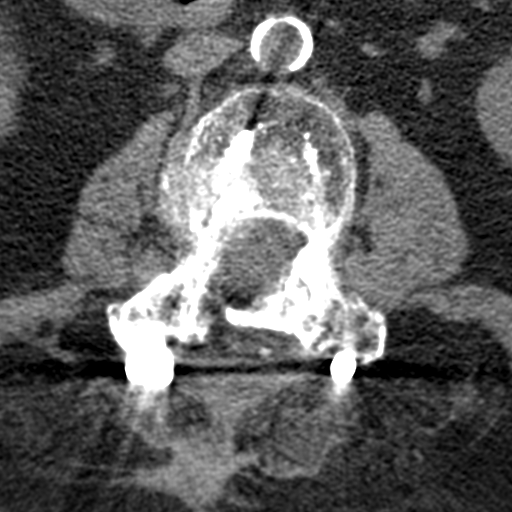
[im 112/150  soft-tissue]
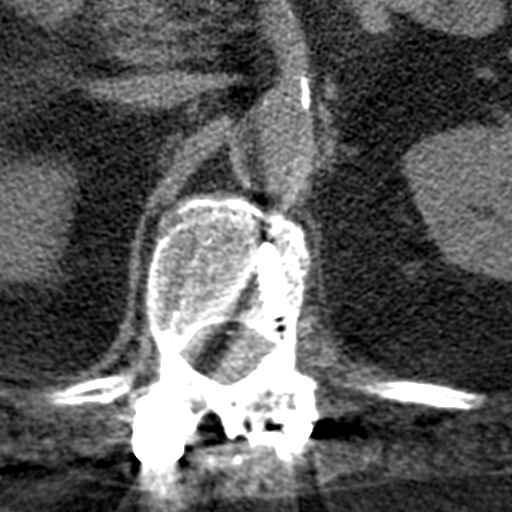

[Series 5: thin soft · axial · 0.29mm/px · z∈[+333,+481]mm · 3 of 150 slices shown]
[im 38/150  soft-tissue]
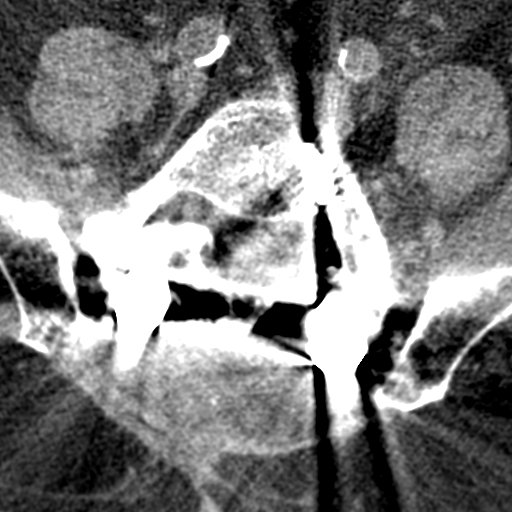
[im 75/150  soft-tissue]
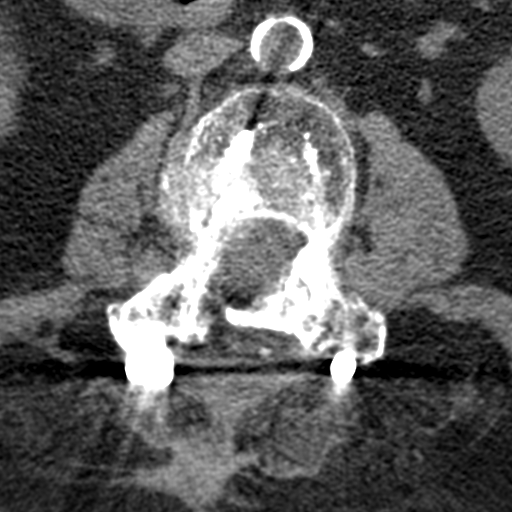
[im 112/150  soft-tissue]
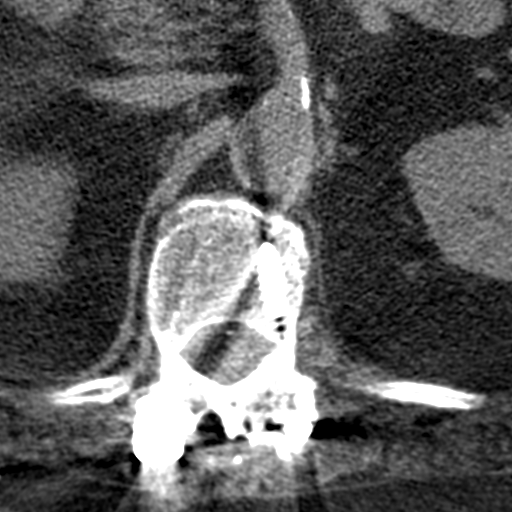

[Series 20: t spine soft · axial · 0.40mm/px · z∈[+596,+713]mm · 2 of 118 slices shown, 3 images]
[im 40/118  soft-tissue]
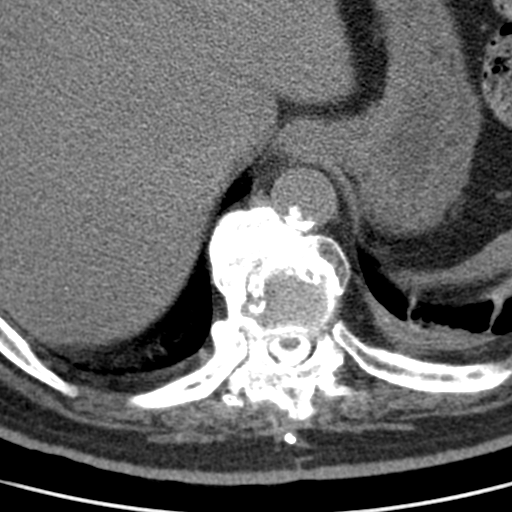
[im 40/118  bone]
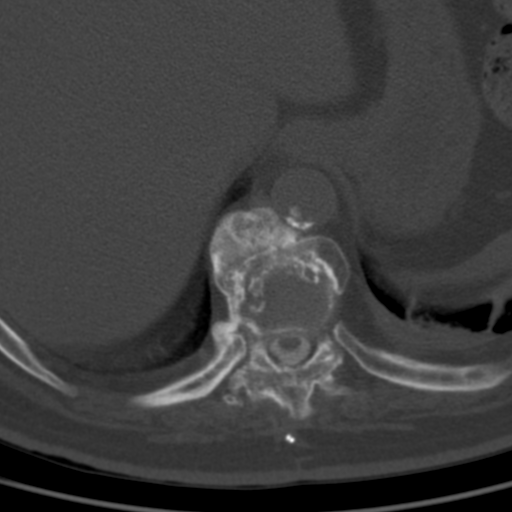
[im 79/118  bone]
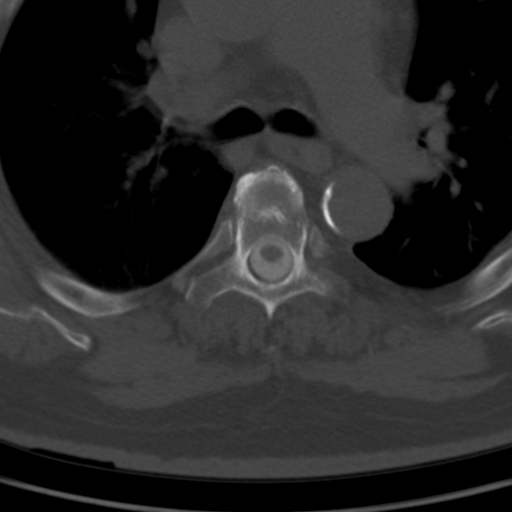

[8 of 14 positions shown; findings below may reference images not displayed]

FLUOROSCOPY TIME:  6 minutes 28 seconds. 900.30 micro gray meter
squared

PROCEDURE:
LUMBAR PUNCTURE FOR CERVICAL AND THORACIC MYELOGRAM

After thorough discussion of risks and benefits of the procedure
including bleeding, infection, injury to nerves, blood vessels,
adjacent structures as well as headache and CSF leak, written and
oral informed consent was obtained. Consent was obtained by Dr. Oma
Blain.

Multiple new shallow attempts were made under fluoroscopy. I could
not gain access to the spinal canal because of posterior fusion
bone. We did a CT scan of the lumbar spine to look for openings in
found a right-sided opening at the level of the L3 pedicle screw.

Patient was positioned prone on the fluoroscopy table. Local
anesthesia was provided with 1% lidocaine without epinephrine after
prepped and draped in the usual sterile fashion. Puncture was
performed at L3 using a 3 1/2 inch 22-gauge spinal needle via right
paramedian approach. The needle was placed within the thecal sac,
with return of clear CSF. 10 mL of Isovue [id number] was injected into
the thecal sac, with normal opacification of the nerve roots and
cauda equina consistent with free flow within the subarachnoid
space. The patient was then moved to the trendelenburg position and
contrast flowed into the Thoracic spine region.

I personally performed the lumbar puncture and administered the
intrathecal contrast. I also personally performed acquisition of the
myelogram images.
FINDINGS: CERVICAL AND THORACIC MYELOGRAM FINDINGS:

Because of patient's body habitus and difficulty turning and
positioning on the table, myelography opacity is insufficient for
useful imaging. Patient was taken to CT for evaluation.

CT CERVICAL MYELOGRAM FINDINGS:

The foramen magnum is widely patent. There has been previous fusion
at C1-2. Fusion is solid. No screw malposition or loosening. Wide
patency of the canal and foramina.

C2-3: No apparent disc pathology. Mild facet osteoarthritis. No
canal or foraminal stenosis.

C3-4: Disc degeneration with endplate osteophytes and bulging of the
disc. Bilateral facet degeneration and hypertrophy. Canal narrowing
with narrowing of the subarachnoid space surrounding the cord. AP
diameter in the midline only 8 mm. No cord compression or deformity.
Bilateral foraminal stenosis could affect either C4 nerve.

C4-5: Chronic fusion.  Wide patency of the canal and foramina.

C5-6: Spondylosis with endplate osteophytes and bulging of the disc.
No compressive canal narrowing. Mild foraminal narrowing, but with
free exiting of the C6 nerves.

C6-7: Disc degeneration with loss of disc height, endplate
osteophytes and mild bulging of the disc. No canal stenosis. No
compressive foraminal narrowing.

C7-T1: No disc bulge or herniation. Bilateral facet osteoarthritis
with bony foraminal narrowing right more than left. Some potential
the right C8 nerve could be affected.

Compared to the study of 11/08/2019, findings appear unchanged.

CT THORACIC MYELOGRAM FINDINGS:

T1-2: No disc abnormality. Mild facet osteoarthritis. Mild bilateral
foraminal narrowing.

T2-3: Disc degeneration with endplate osteophytes and bulging of the
disc. Narrowing of the ventral subarachnoid space but no compression
of cord. Fusion across the facet joints, new since prior exam.
Sufficient patency of the foramina. There has been some slight
interval loss of height the T3 vertebral body since the prior study,
with superior endplate Schmorl's nodes.

T3-4: Mild bulging of the disc. Bilateral facet osteoarthritis. No
compressive canal or foraminal narrowing.

T4-5: Fusion across the facets.  No disc pathology.  No stenosis.

T5-6: No disc pathology. Mild facet osteoarthritis. Anterior
osteophytes without definite solid bridging. No canal or foraminal
stenosis.

T6 through T10: Large anterior osteophytes with solid bridging.

T6-7: No canal or foraminal stenosis.

T7-8: No canal or foraminal stenosis. Neuro stimulators enter at the
T8-9 interspace nerve present within the dorsal midline. Proximal
neuro stimulators are slightly dorsal to the epidural space.

T8-9: 1 mm of fixed anterolisthesis. Facet osteoarthritis worse on
the right. No canal stenosis. Mild right foraminal stenosis.

T9-10: Solid bridging osteophytes. No canal stenosis. Mild bilateral
bony foraminal stenosis.

T10 to L1: Previous posterior fusion. Pedicle screws are well
positioned without evidence of loosening. Solid union throughout the
region. No disc pathology at T10-11 or T11-12. Chronic disc
protrusion with partial calcification at T12-L1 but no apparent
neural compression.
IMPRESSION: Cervical region: No change since the study of 11/08/2019. Good
appearance of the fusion level of C1-2.

C2-3: Facet osteoarthritis without compressive stenosis.

C3-4: Spondylosis with canal narrowing, AP diameter 8 mm, but no
apparent cord compression or deformity. Bilateral foraminal stenosis
that could affect either C4 nerve.

C4-5: Chronic fusion.  No stenosis.

C5-6 and C6-7: Spondylosis without compressive narrowing of canal or
foramina.

C7-T1: Bilateral foraminal narrowing due to encroachment by facet
osteophytes. Either C8 nerve could be affected, more likely the
right.

Thoracic region:

T1-2: Facet osteoarthritis with mild bilateral foraminal narrowing.

T2-3: Endplate osteophytes and bulging of the disc. Narrowing of the
ventral subarachnoid space but no compression of the cord. Fusion of
the facets, new since the previous study. Some interval loss of
height of the T3 vertebral body with superior endplate Schmorl's
node.

T3-4: Facet osteoarthritis but without evidence of neural
compression.

T4-5: Solid facet fusion as seen previously.

T5-6: Near solid anterior osteophytes. Mild facet osteoarthritis. No
stenosis.

T6 to the lumbar region: Solid fusion, due to solid bridging
anterior osteophytes from T6-T10 and due to posterior fusion
procedure from T10 to the lumbar region. No canal stenosis. Some
bony foraminal narrowing, more notable on the right at T8-9 and
bilateral at T9-10.

Dorsal neuro stimulators appear the same as seen previously,
centered at the T8 level.
